# Patient Record
Sex: Male | Born: 1960 | Hispanic: No | Marital: Single | Smoking: Current every day smoker
Health system: Southern US, Community
[De-identification: ages and names within clinical notes are randomized; demographics above are authoritative.]

## PROBLEM LIST (undated history)

## (undated) DIAGNOSIS — F4310 Post-traumatic stress disorder, unspecified: Secondary | ICD-10-CM

## (undated) DIAGNOSIS — M722 Plantar fascial fibromatosis: Secondary | ICD-10-CM

## (undated) DIAGNOSIS — M1990 Unspecified osteoarthritis, unspecified site: Secondary | ICD-10-CM

## (undated) DIAGNOSIS — F319 Bipolar disorder, unspecified: Secondary | ICD-10-CM

---

## 2009-02-06 ENCOUNTER — Emergency Department (HOSPITAL_COMMUNITY): Admission: EM | Admit: 2009-02-06 | Discharge: 2009-02-08 | Payer: Self-pay | Admitting: Emergency Medicine

## 2009-02-08 ENCOUNTER — Inpatient Hospital Stay (HOSPITAL_COMMUNITY): Admission: AD | Admit: 2009-02-08 | Discharge: 2009-02-11 | Payer: Self-pay | Admitting: Psychiatry

## 2009-02-08 ENCOUNTER — Ambulatory Visit: Payer: Self-pay | Admitting: Psychiatry

## 2009-02-13 ENCOUNTER — Emergency Department (HOSPITAL_COMMUNITY): Admission: EM | Admit: 2009-02-13 | Discharge: 2009-02-13 | Payer: Self-pay | Admitting: Family Medicine

## 2009-09-26 ENCOUNTER — Emergency Department (HOSPITAL_COMMUNITY): Admission: EM | Admit: 2009-09-26 | Discharge: 2009-09-26 | Payer: Self-pay | Admitting: Family Medicine

## 2010-07-11 LAB — DIFFERENTIAL
Basophils Absolute: 0 10*3/uL (ref 0.0–0.1)
Basophils Relative: 1 % (ref 0–1)
Eosinophils Absolute: 0.3 10*3/uL (ref 0.0–0.7)
Eosinophils Relative: 4 % (ref 0–5)
Monocytes Absolute: 0.6 10*3/uL (ref 0.1–1.0)

## 2010-07-11 LAB — CBC
HCT: 46.8 % (ref 39.0–52.0)
Hemoglobin: 15.9 g/dL (ref 13.0–17.0)
MCHC: 33.9 g/dL (ref 30.0–36.0)
MCV: 95.1 fL (ref 78.0–100.0)
RDW: 13.1 % (ref 11.5–15.5)

## 2010-07-28 LAB — CBC
Hemoglobin: 16.1 g/dL (ref 13.0–17.0)
MCHC: 34.2 g/dL (ref 30.0–36.0)
Platelets: 213 10*3/uL (ref 150–400)
RDW: 12 % (ref 11.5–15.5)

## 2010-07-28 LAB — TRICYCLICS SCREEN, URINE: TCA Scrn: NOT DETECTED

## 2010-07-28 LAB — DIFFERENTIAL
Basophils Absolute: 0.1 10*3/uL (ref 0.0–0.1)
Basophils Relative: 2 % — ABNORMAL HIGH (ref 0–1)
Monocytes Absolute: 0.6 10*3/uL (ref 0.1–1.0)
Neutro Abs: 4.4 10*3/uL (ref 1.7–7.7)
Neutrophils Relative %: 61 % (ref 43–77)

## 2010-07-28 LAB — RAPID URINE DRUG SCREEN, HOSP PERFORMED: Cocaine: POSITIVE — AB

## 2012-09-29 ENCOUNTER — Emergency Department (HOSPITAL_COMMUNITY)
Admission: EM | Admit: 2012-09-29 | Discharge: 2012-09-29 | Disposition: A | Discharge status: Home / Self Care | Payer: Self-pay | Source: Home / Self Care | Attending: Family Medicine | Admitting: Family Medicine

## 2012-09-29 ENCOUNTER — Encounter (HOSPITAL_COMMUNITY): Payer: Self-pay

## 2012-09-29 DIAGNOSIS — M722 Plantar fascial fibromatosis: Secondary | ICD-10-CM

## 2012-09-29 HISTORY — DX: Post-traumatic stress disorder, unspecified: F43.10

## 2012-09-29 MED ORDER — LIDOCAINE 5 % EX PTCH: MEDICATED_PATCH | CUTANEOUS | Status: DC

## 2012-09-29 MED ORDER — CYCLOBENZAPRINE HCL 10 MG PO TABS: 10.0000 mg | ORAL_TABLET | Freq: Every evening | ORAL | Status: DC | PRN

## 2012-09-29 MED ORDER — IBUPROFEN 600 MG PO TABS: 600.0000 mg | ORAL_TABLET | Freq: Three times a day (TID) | ORAL | Status: DC

## 2012-09-29 MED ORDER — HYDROCODONE-ACETAMINOPHEN 5-325 MG PO TABS: 2.0000 | ORAL_TABLET | Freq: Three times a day (TID) | ORAL | Status: DC | PRN

## 2012-09-29 MED ORDER — TRAMADOL HCL 50 MG PO TABS: 50.0000 mg | ORAL_TABLET | Freq: Four times a day (QID) | ORAL | Status: DC | PRN

## 2012-09-29 NOTE — ED Notes (Signed)
MD aware of new allergy

## 2012-09-29 NOTE — ED Notes (Signed)
Reports his new shoe inserts from podiatrist do not help his plantar surface foot pain; had the inserts for 1 month; cannot get back in to see his MD or the MD at the [facility] for a while ; NAD

## 2012-09-30 NOTE — ED Provider Notes (Signed)
History     CSN: [id number]  Arrival date & time 09/29/12  1614   First MD Initiated Contact with Patient 09/29/12 1724      Chief Complaint  Patient presents with  . Foot Pain    (Consider location/radiation/quality/duration/timing/severity/associated sxs/prior treatment) HPI Comments: 52 year old smoker male here complaining of left foot sole pain for one month. Has had intermittent pain on the right side as well but worse on the left. He has been seen at the [facility] for this symptom and had a recommendation to use shoe inserts he used this inserts for a while without improvement. States pain is worse at nighttime. Has kept him awake in the last 2 nights. Constant massage of the soles helped some. Has a history of pes planus. Denies recent falls. Or known injury. Patient wears cowboy boots.    Past Medical History  Diagnosis Date  . PTSD (post-traumatic stress disorder)     History reviewed. No pertinent past surgical history.  History reviewed. No pertinent family history.  History  Substance Use Topics  . Smoking status: Current Every Day Smoker  . Smokeless tobacco: Not on file  . Alcohol Use: No      Review of Systems  Constitutional: Negative for fever, chills and fatigue.  Musculoskeletal: Negative for back pain.       Left foot pain as per history of present illness per  Skin: Negative for rash and wound.  All other systems reviewed and are negative.    Allergies  Tramadol  Home Medications   Current Outpatient Rx  Name  Route  Sig  Dispense  Refill  . ARIPiprazole (ABILIFY) 10 MG tablet   Oral   Take 10 mg by mouth daily.         . cyclobenzaprine (FLEXERIL) 10 MG tablet   Oral   Take 1 tablet (10 mg total) by mouth at bedtime as needed for muscle spasms.   15 tablet   0   . HYDROcodone-acetaminophen (NORCO/VICODIN) 5-325 MG per tablet   Oral   Take 2 tablets by mouth every 8 (eight) hours as needed for pain.   6 tablet   0   .  ibuprofen (ADVIL,MOTRIN) 600 MG tablet   Oral   Take 1 tablet (600 mg total) by mouth 3 (three) times daily after meals.   21 tablet   0   . lidocaine (LIDODERM) 5 %      Cut each patch in 4 pieces and apply 1 to 3 pieces over tender area. Remove & Discard patch once an daily.   6 patch   0     BP 149/94  Pulse 86  Temp(Src) 98.9 F (37.2 C) (Oral)  Resp 19  SpO2 100%  Physical Exam  Nursing note and vitals reviewed. Constitutional: He is oriented to person, place, and time. He appears well-developed and well-nourished. No distress.  Cardiovascular: Normal heart sounds.   Pulmonary/Chest: Breath sounds normal.  Musculoskeletal:  left foot: pes planus. Impress descended prominent medial foot. There is tenderness to palpation over medial border of mid foot and anterior to heel. Impress thin sole pad.  No bruising. No wounds or calluses.  Entire left foot Impress neurovascularly intact.   Neurological: He is alert and oriented to person, place, and time.  Skin: No rash noted. He is not diaphoretic.    ED Course  Procedures (including critical care time)  Labs Reviewed - No data to display No results found.   1. Plantar fasciitis  MDM  Prescribed flexeril, norco #6 tablets, ibuprofen and lidocaine patch.  Supportive care and red flags that should prompt his return to medical attention discussed with patient and provided in writing.  Sports medicine referral as needed.         Sharin Grave, MD 09/30/12 [phone]

## 2014-03-16 ENCOUNTER — Emergency Department (HOSPITAL_COMMUNITY)
Admission: EM | Admit: 2014-03-16 | Discharge: 2014-03-17 | Disposition: A | Discharge status: Home / Self Care | Payer: Self-pay | Attending: Emergency Medicine | Admitting: Emergency Medicine

## 2014-03-16 DIAGNOSIS — IMO0002 Reserved for concepts with insufficient information to code with codable children: Secondary | ICD-10-CM

## 2014-03-16 DIAGNOSIS — S61411A Laceration without foreign body of right hand, initial encounter: Secondary | ICD-10-CM | POA: Insufficient documentation

## 2014-03-16 DIAGNOSIS — Z720 Tobacco use: Secondary | ICD-10-CM | POA: Insufficient documentation

## 2014-03-16 DIAGNOSIS — S61419A Laceration without foreign body of unspecified hand, initial encounter: Secondary | ICD-10-CM

## 2014-03-16 DIAGNOSIS — S76109A Unspecified injury of unspecified quadriceps muscle, fascia and tendon, initial encounter: Secondary | ICD-10-CM | POA: Insufficient documentation

## 2014-03-16 DIAGNOSIS — M1990 Unspecified osteoarthritis, unspecified site: Secondary | ICD-10-CM | POA: Insufficient documentation

## 2014-03-16 DIAGNOSIS — Y9389 Activity, other specified: Secondary | ICD-10-CM | POA: Insufficient documentation

## 2014-03-16 DIAGNOSIS — W25XXXA Contact with sharp glass, initial encounter: Secondary | ICD-10-CM | POA: Insufficient documentation

## 2014-03-16 DIAGNOSIS — Z23 Encounter for immunization: Secondary | ICD-10-CM | POA: Insufficient documentation

## 2014-03-16 DIAGNOSIS — Y9289 Other specified places as the place of occurrence of the external cause: Secondary | ICD-10-CM | POA: Insufficient documentation

## 2014-03-16 DIAGNOSIS — Z79899 Other long term (current) drug therapy: Secondary | ICD-10-CM | POA: Insufficient documentation

## 2014-03-16 DIAGNOSIS — Y998 Other external cause status: Secondary | ICD-10-CM | POA: Insufficient documentation

## 2014-03-16 DIAGNOSIS — F319 Bipolar disorder, unspecified: Secondary | ICD-10-CM | POA: Insufficient documentation

## 2014-03-16 MED ORDER — TETANUS-DIPHTH-ACELL PERTUSSIS 5-2.5-18.5 LF-MCG/0.5 IM SUSP
0.5000 mL | Freq: Once | INTRAMUSCULAR | Status: AC
  Administered 2014-03-17: 0.5 mL via INTRAMUSCULAR
  Filled 2014-03-16: qty 0.5
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
## 2014-03-17 ENCOUNTER — Ambulatory Visit (HOSPITAL_COMMUNITY)
Admission: AD | Admit: 2014-03-17 | Discharge: 2014-03-17 | Disposition: A | Discharge status: Home / Self Care | Payer: Non-veteran care | Source: Ambulatory Visit | Attending: Orthopedic Surgery | Admitting: Orthopedic Surgery

## 2014-03-17 ENCOUNTER — Other Ambulatory Visit: Payer: Self-pay | Admitting: Orthopedic Surgery

## 2014-03-17 ENCOUNTER — Encounter (HOSPITAL_COMMUNITY): Payer: Self-pay | Admitting: *Deleted

## 2014-03-17 ENCOUNTER — Emergency Department (HOSPITAL_COMMUNITY): Payer: Self-pay

## 2014-03-17 ENCOUNTER — Ambulatory Visit (HOSPITAL_COMMUNITY): Payer: Non-veteran care

## 2014-03-17 ENCOUNTER — Encounter (HOSPITAL_COMMUNITY): Payer: Self-pay | Admitting: Emergency Medicine

## 2014-03-17 ENCOUNTER — Encounter (HOSPITAL_COMMUNITY)
Admission: AD | Disposition: A | Discharge status: Home / Self Care | Payer: Self-pay | Source: Ambulatory Visit | Attending: Orthopedic Surgery

## 2014-03-17 DIAGNOSIS — S66124A Laceration of flexor muscle, fascia and tendon of right ring finger at wrist and hand level, initial encounter: Secondary | ICD-10-CM | POA: Diagnosis not present

## 2014-03-17 DIAGNOSIS — S66126A Laceration of flexor muscle, fascia and tendon of right little finger at wrist and hand level, initial encounter: Secondary | ICD-10-CM | POA: Diagnosis not present

## 2014-03-17 DIAGNOSIS — F17210 Nicotine dependence, cigarettes, uncomplicated: Secondary | ICD-10-CM | POA: Insufficient documentation

## 2014-03-17 DIAGNOSIS — M1990 Unspecified osteoarthritis, unspecified site: Secondary | ICD-10-CM | POA: Diagnosis not present

## 2014-03-17 DIAGNOSIS — S65510A Laceration of blood vessel of right index finger, initial encounter: Secondary | ICD-10-CM | POA: Insufficient documentation

## 2014-03-17 DIAGNOSIS — F319 Bipolar disorder, unspecified: Secondary | ICD-10-CM | POA: Diagnosis not present

## 2014-03-17 DIAGNOSIS — S61411A Laceration without foreign body of right hand, initial encounter: Secondary | ICD-10-CM | POA: Insufficient documentation

## 2014-03-17 DIAGNOSIS — M722 Plantar fascial fibromatosis: Secondary | ICD-10-CM | POA: Insufficient documentation

## 2014-03-17 DIAGNOSIS — F4310 Post-traumatic stress disorder, unspecified: Secondary | ICD-10-CM | POA: Diagnosis not present

## 2014-03-17 DIAGNOSIS — S64490A Injury of digital nerve of right index finger, initial encounter: Secondary | ICD-10-CM | POA: Diagnosis not present

## 2014-03-17 DIAGNOSIS — W25XXXA Contact with sharp glass, initial encounter: Secondary | ICD-10-CM | POA: Insufficient documentation

## 2014-03-17 DIAGNOSIS — Z79899 Other long term (current) drug therapy: Secondary | ICD-10-CM | POA: Diagnosis not present

## 2014-03-17 DIAGNOSIS — Z791 Long term (current) use of non-steroidal anti-inflammatories (NSAID): Secondary | ICD-10-CM | POA: Diagnosis not present

## 2014-03-17 DIAGNOSIS — S66120A Laceration of flexor muscle, fascia and tendon of right index finger at wrist and hand level, initial encounter: Secondary | ICD-10-CM | POA: Diagnosis present

## 2014-03-17 HISTORY — PX: NERVE, TENDON AND ARTERY REPAIR: SHX5695

## 2014-03-17 HISTORY — DX: Plantar fascial fibromatosis: M72.2

## 2014-03-17 HISTORY — DX: Bipolar disorder, unspecified: F31.9

## 2014-03-17 HISTORY — DX: Unspecified osteoarthritis, unspecified site: M19.90

## 2014-03-17 LAB — I-STAT CHEM 8, ED
BUN: 20 mg/dL (ref 6–23)
CALCIUM ION: 1.15 mmol/L (ref 1.12–1.23)
CREATININE: 0.9 mg/dL (ref 0.50–1.35)
Chloride: 105 mEq/L (ref 96–112)
Glucose, Bld: 95 mg/dL (ref 70–99)
HCT: 45 % (ref 39.0–52.0)
HEMOGLOBIN: 15.3 g/dL (ref 13.0–17.0)
Potassium: 3.9 mEq/L (ref 3.7–5.3)
SODIUM: 139 meq/L (ref 137–147)
TCO2: 22 mmol/L (ref 0–100)

## 2014-03-17 SURGERY — NERVE, TENDON AND ARTERY REPAIR
Anesthesia: General | Site: Hand | Laterality: Right

## 2014-03-17 MED ORDER — MIDAZOLAM HCL 2 MG/2ML IJ SOLN
INTRAMUSCULAR | Status: AC
  Filled 2014-03-17: qty 2

## 2014-03-17 MED ORDER — PROPOFOL 10 MG/ML IV BOLUS
INTRAVENOUS | Status: DC | PRN
  Administered 2014-03-17: 170 mg via INTRAVENOUS

## 2014-03-17 MED ORDER — OXYCODONE HCL 5 MG/5ML PO SOLN: 5.0000 mg | Freq: Once | ORAL | Status: AC | PRN

## 2014-03-17 MED ORDER — LIDOCAINE HCL (PF) 1 % IJ SOLN
INTRAMUSCULAR | Status: AC
  Filled 2014-03-17: qty 5

## 2014-03-17 MED ORDER — SUCCINYLCHOLINE CHLORIDE 20 MG/ML IJ SOLN
INTRAMUSCULAR | Status: DC | PRN
  Administered 2014-03-17: 100 mg via INTRAVENOUS

## 2014-03-17 MED ORDER — LORAZEPAM 2 MG/ML IJ SOLN
1.0000 mg | Freq: Once | INTRAMUSCULAR | Status: AC
  Administered 2014-03-17: 1 mg via INTRAVENOUS

## 2014-03-17 MED ORDER — HYDROMORPHONE HCL 1 MG/ML IJ SOLN
INTRAMUSCULAR | Status: AC
  Filled 2014-03-17: qty 1

## 2014-03-17 MED ORDER — MIDAZOLAM HCL 5 MG/5ML IJ SOLN
INTRAMUSCULAR | Status: DC | PRN
  Administered 2014-03-17: 2 mg via INTRAVENOUS

## 2014-03-17 MED ORDER — LIDOCAINE HCL (PF) 1 % IJ SOLN
5.0000 mL | Freq: Once | INTRAMUSCULAR | Status: DC
  Filled 2014-03-17: qty 5

## 2014-03-17 MED ORDER — HYDROMORPHONE HCL 1 MG/ML IJ SOLN
0.2500 mg | INTRAMUSCULAR | Status: DC | PRN
  Administered 2014-03-17 (×3): 0.5 mg via INTRAVENOUS

## 2014-03-17 MED ORDER — FENTANYL CITRATE 0.05 MG/ML IJ SOLN
INTRAMUSCULAR | Status: AC
  Filled 2014-03-17: qty 5

## 2014-03-17 MED ORDER — CEPHALEXIN 500 MG PO CAPS: 500.0000 mg | ORAL_CAPSULE | Freq: Four times a day (QID) | ORAL | Status: DC

## 2014-03-17 MED ORDER — HYDROCODONE-ACETAMINOPHEN 5-325 MG PO TABS: 1.0000 | ORAL_TABLET | Freq: Three times a day (TID) | ORAL | Status: DC | PRN

## 2014-03-17 MED ORDER — PHENYLEPHRINE HCL 10 MG/ML IJ SOLN
INTRAMUSCULAR | Status: DC | PRN
  Administered 2014-03-17: 80 ug via INTRAVENOUS

## 2014-03-17 MED ORDER — LACTATED RINGERS IV SOLN
INTRAVENOUS | Status: DC | PRN
  Administered 2014-03-17: 17:00:00 via INTRAVENOUS

## 2014-03-17 MED ORDER — FENTANYL CITRATE 0.05 MG/ML IJ SOLN
100.0000 ug | Freq: Once | INTRAMUSCULAR | Status: AC
  Administered 2014-03-17: 100 ug via INTRAVENOUS
  Filled 2014-03-17: qty 2

## 2014-03-17 MED ORDER — MEPERIDINE HCL 25 MG/ML IJ SOLN: 6.2500 mg | INTRAMUSCULAR | Status: DC | PRN

## 2014-03-17 MED ORDER — CHLORHEXIDINE GLUCONATE 4 % EX LIQD
60.0000 mL | Freq: Once | CUTANEOUS | Status: DC
  Filled 2014-03-17: qty 60

## 2014-03-17 MED ORDER — ONDANSETRON HCL 4 MG/2ML IJ SOLN: 4.0000 mg | Freq: Once | INTRAMUSCULAR | Status: DC | PRN

## 2014-03-17 MED ORDER — BUPIVACAINE HCL (PF) 0.25 % IJ SOLN
INTRAMUSCULAR | Status: AC
  Filled 2014-03-17: qty 30

## 2014-03-17 MED ORDER — CEFAZOLIN SODIUM-DEXTROSE 2-3 GM-% IV SOLR
2.0000 g | INTRAVENOUS | Status: AC
  Administered 2014-03-17: 2 g via INTRAVENOUS
  Filled 2014-03-17: qty 50

## 2014-03-17 MED ORDER — OXYCODONE-ACETAMINOPHEN 5-325 MG PO TABS: 1.0000 | ORAL_TABLET | ORAL | Status: DC | PRN

## 2014-03-17 MED ORDER — FENTANYL CITRATE 0.05 MG/ML IJ SOLN
50.0000 ug | INTRAMUSCULAR | Status: DC | PRN
  Administered 2014-03-17: 50 ug via INTRAVENOUS

## 2014-03-17 MED ORDER — BUPIVACAINE HCL (PF) 0.25 % IJ SOLN
INTRAMUSCULAR | Status: DC | PRN
  Administered 2014-03-17: 15 mL

## 2014-03-17 MED ORDER — MIDAZOLAM HCL 2 MG/2ML IJ SOLN: 1.0000 mg | INTRAMUSCULAR | Status: DC | PRN

## 2014-03-17 MED ORDER — OXYCODONE HCL 5 MG PO TABS
ORAL_TABLET | ORAL | Status: AC
  Filled 2014-03-17: qty 1

## 2014-03-17 MED ORDER — LACTATED RINGERS IV SOLN
INTRAVENOUS | Status: DC
  Administered 2014-03-17: 14:00:00 via INTRAVENOUS

## 2014-03-17 MED ORDER — LIDOCAINE HCL (PF) 1 % IJ SOLN: 10.0000 mL | Freq: Once | INTRAMUSCULAR | Status: DC

## 2014-03-17 MED ORDER — LIDOCAINE HCL (CARDIAC) 20 MG/ML IV SOLN
INTRAVENOUS | Status: DC | PRN
  Administered 2014-03-17: 50 mg via INTRAVENOUS

## 2014-03-17 MED ORDER — OXYCODONE HCL 5 MG PO TABS
5.0000 mg | ORAL_TABLET | Freq: Once | ORAL | Status: AC | PRN
  Administered 2014-03-17: 5 mg via ORAL

## 2014-03-17 MED ORDER — CEFAZOLIN SODIUM 1-5 GM-% IV SOLN
1.0000 g | Freq: Once | INTRAVENOUS | Status: AC
  Administered 2014-03-17: 1 g via INTRAVENOUS
  Filled 2014-03-17: qty 50

## 2014-03-17 MED ORDER — SODIUM CHLORIDE 0.9 % IR SOLN
Status: DC | PRN
  Administered 2014-03-17: 1000 mL

## 2014-03-17 MED ORDER — FENTANYL CITRATE 0.05 MG/ML IJ SOLN
INTRAMUSCULAR | Status: AC
  Administered 2014-03-17: 50 ug via INTRAVENOUS
  Filled 2014-03-17: qty 2

## 2014-03-17 MED ORDER — FENTANYL CITRATE 0.05 MG/ML IJ SOLN
INTRAMUSCULAR | Status: DC | PRN
  Administered 2014-03-17 (×2): 50 ug via INTRAVENOUS
  Administered 2014-03-17 (×2): 100 ug via INTRAVENOUS
  Administered 2014-03-17: 50 ug via INTRAVENOUS

## 2014-03-17 SURGICAL SUPPLY — 56 items
BANDAGE ADH SHEER 1  50/CT (GAUZE/BANDAGES/DRESSINGS) ×3 IMPLANT
BANDAGE ELASTIC 4 VELCRO ST LF (GAUZE/BANDAGES/DRESSINGS) ×3 IMPLANT
BNDG CMPR 9X4 STRL LF SNTH (GAUZE/BANDAGES/DRESSINGS) ×1
BNDG ESMARK 4X9 LF (GAUZE/BANDAGES/DRESSINGS) ×3 IMPLANT
BNDG GAUZE ELAST 4 BULKY (GAUZE/BANDAGES/DRESSINGS) ×3 IMPLANT
CLOSURE WOUND 1/2 X4 (GAUZE/BANDAGES/DRESSINGS)
CORDS BIPOLAR (ELECTRODE) ×3 IMPLANT
COVER SURGICAL LIGHT HANDLE (MISCELLANEOUS) ×3 IMPLANT
CUFF TOURNIQUET SINGLE 18IN (TOURNIQUET CUFF) IMPLANT
CUFF TOURNIQUET SINGLE 24IN (TOURNIQUET CUFF) ×3 IMPLANT
DECANTER SPIKE VIAL GLASS SM (MISCELLANEOUS) IMPLANT
DRAPE OEC MINIVIEW 54X84 (DRAPES) IMPLANT
DRAPE SURG 17X23 STRL (DRAPES) ×3 IMPLANT
DURAPREP 26ML APPLICATOR (WOUND CARE) ×3 IMPLANT
GAUZE SPONGE 4X4 12PLY STRL (GAUZE/BANDAGES/DRESSINGS) IMPLANT
GAUZE XEROFORM 1X8 LF (GAUZE/BANDAGES/DRESSINGS) ×3 IMPLANT
GLOVE BIO SURGEON STRL SZ8.5 (GLOVE) ×3 IMPLANT
GLOVE BIOGEL PI IND STRL 8 (GLOVE) ×1 IMPLANT
GLOVE BIOGEL PI INDICATOR 8 (GLOVE) ×2
GOWN STRL REUS W/ TWL LRG LVL3 (GOWN DISPOSABLE) ×1 IMPLANT
GOWN STRL REUS W/ TWL XL LVL3 (GOWN DISPOSABLE) ×1 IMPLANT
GOWN STRL REUS W/TWL LRG LVL3 (GOWN DISPOSABLE) ×3
GOWN STRL REUS W/TWL XL LVL3 (GOWN DISPOSABLE) ×3
KIT BASIN OR (CUSTOM PROCEDURE TRAY) ×3 IMPLANT
KIT ROOM TURNOVER OR (KITS) ×3 IMPLANT
LOOP VESSEL MAXI BLUE (MISCELLANEOUS) IMPLANT
MANIFOLD NEPTUNE II (INSTRUMENTS) IMPLANT
NEEDLE HYPO 25GX1X1/2 BEV (NEEDLE) ×3 IMPLANT
NS IRRIG 1000ML POUR BTL (IV SOLUTION) ×3 IMPLANT
PACK ORTHO EXTREMITY (CUSTOM PROCEDURE TRAY) ×3 IMPLANT
PAD ARMBOARD 7.5X6 YLW CONV (MISCELLANEOUS) ×6 IMPLANT
PAD CAST 4YDX4 CTTN HI CHSV (CAST SUPPLIES) ×1 IMPLANT
PADDING CAST COTTON 4X4 STRL (CAST SUPPLIES) ×3
SPEAR EYE SURG WECK-CEL (MISCELLANEOUS) IMPLANT
SPECIMEN JAR SMALL (MISCELLANEOUS) IMPLANT
SPLINT FIBERGLASS 3X12 (CAST SUPPLIES) ×3 IMPLANT
SPONGE GAUZE 4X4 12PLY STER LF (GAUZE/BANDAGES/DRESSINGS) ×3 IMPLANT
STRIP CLOSURE SKIN 1/2X4 (GAUZE/BANDAGES/DRESSINGS) IMPLANT
SUCTION FRAZIER TIP 10 FR DISP (SUCTIONS) ×3 IMPLANT
SUT ETHIBOND 3-0 V-5 (SUTURE) ×6 IMPLANT
SUT ETHILON 4 0 P 3 18 (SUTURE) ×3 IMPLANT
SUT ETHILON 5 0 PS 2 18 (SUTURE) IMPLANT
SUT MERSILENE 4 0 P 3 (SUTURE) ×3 IMPLANT
SUT NYLON 9 0 VRM6 (SUTURE) ×3 IMPLANT
SUT PROLENE 3 0 PS 2 (SUTURE) IMPLANT
SUT PROLENE 6 0 C 1 30 (SUTURE) ×3 IMPLANT
SUT SILK 4 0 PS 2 (SUTURE) IMPLANT
SUT VIC AB 3-0 FS2 27 (SUTURE) IMPLANT
SUT VICRYL RAPIDE 4/0 PS 2 (SUTURE) ×12 IMPLANT
SYR CONTROL 10ML LL (SYRINGE) ×3 IMPLANT
TOWEL OR 17X24 6PK STRL BLUE (TOWEL DISPOSABLE) ×3 IMPLANT
TOWEL OR 17X26 10 PK STRL BLUE (TOWEL DISPOSABLE) ×3 IMPLANT
TUBE CONNECTING 12'X1/4 (SUCTIONS) ×1
TUBE CONNECTING 12X1/4 (SUCTIONS) ×2 IMPLANT
UNDERPAD 30X30 INCONTINENT (UNDERPADS AND DIAPERS) ×3 IMPLANT
WATER STERILE IRR 1000ML POUR (IV SOLUTION) IMPLANT

## 2014-03-17 NOTE — ED Notes (Signed)
Sutures present on right hand. Pressure gauze applied.

## 2014-03-17 NOTE — ED Notes (Signed)
Dr. Ranae Palmsyelverton and Tresa EndoKelly, PA-C, at the bedside.

## 2014-03-17 NOTE — ED Notes (Signed)
Called main pharmacy and spoke to veronda, 1 mg of ativan given to patient, 1mg  waste, verified with Ian MalkinZach, RCharity fundraiser

## 2014-03-17 NOTE — Anesthesia Procedure Notes (Signed)
Procedure Name: Intubation Date/Time: 03/17/2014 4:54 PM Performed by: Gwenyth AllegraADAMI, Lamoine Magallon Pre-anesthesia Checklist: Emergency Drugs available, Patient identified, Timeout performed, Suction available and Patient being monitored Patient Re-evaluated:Patient Re-evaluated prior to inductionOxygen Delivery Method: Circle system utilized Preoxygenation: Pre-oxygenation with 100% oxygen Intubation Type: IV induction Ventilation: Mask ventilation without difficulty Laryngoscope Size: Mac and 4 Grade View: Grade I Tube type: Oral Tube size: 7.5 mm Number of attempts: 1 Airway Equipment and Method: Stylet Placement Confirmation: ETT inserted through vocal cords under direct vision,  breath sounds checked- equal and bilateral and positive ETCO2 Secured at: 22 cm Tube secured with: Tape Dental Injury: Teeth and Oropharynx as per pre-operative assessment

## 2014-03-17 NOTE — Anesthesia Postprocedure Evaluation (Signed)
  Anesthesia Post-op Note  Patient: Kenneth Oneill  Procedure(s) Performed: Procedure(s): EXPLORE AND REPAIR RIGHT HAND (Right)  Patient Location: PACU  Anesthesia Type:General  Level of Consciousness: awake and alert   Airway and Oxygen Therapy: Patient Spontanous Breathing  Post-op Pain: mild  Post-op Assessment: Post-op Vital signs reviewed  Post-op Vital Signs: stable  Last Vitals:  Filed Vitals:   03/17/14 1954  BP: 130/91  Pulse: 94  Temp:   Resp: 13    Complications: No apparent anesthesia complications

## 2014-03-17 NOTE — ED Notes (Signed)
Dr. Ranae PalmsYelverton present on arrival to room

## 2014-03-17 NOTE — ED Notes (Signed)
Spoke to Dr. Ranae PalmsYelverton in regards to pain. MD gives verbal order for 100mcg of fentanyl.  Patient is npo, last meal 5pm.

## 2014-03-17 NOTE — Op Note (Signed)
NAMSherlie Ban:  Oneill, Kenneth Oneill              ACCOUNT NO.:  [id number]  MEDICAL RECORD NO.:  [id number]  LOCATION:  [facility]                         FACILITY:  [facility]  PHYSICIAN:  Artist PaisMatthew A. Arlet Marter, M.D.DATE OF BIRTH:  01-15-1961  DATE OF PROCEDURE:  03/17/2014 DATE OF DISCHARGE:  03/17/2014                              OPERATIVE REPORT   PREOPERATIVE DIAGNOSIS:  Deep laceration palmar aspect, right hand.  POSTOPERATIVE DIAGNOSIS:  Deep laceration palmar aspect, right hand.  PROCEDURE: 1. Zone II flexor digitorum superficialis and profundus repairs, right     small finger. 2. Zone II profundus repair, right ring finger, and microscopic repair     of radial digital artery and radial digital nerve, index finger.  SURGEON:  Artist PaisMatthew A. Mina MarbleWeingold, MD  ASSISTANT:  None.  ANESTHESIA:  General.  COMPLICATION:  No complication.  DRAINS:  No drains.  DESCRIPTION OF PROCEDURE:  The patient was taken to the operating suite. After induction of adequate general anesthetic, right upper extremity was prepped and draped in sterile fashion.  Prior to raising the tourniquet, sutures that were placed in the emergency room were removed from a transverse laceration along the A1 pulley area of the index, long, ring, and small fingers with loss of active flexion of the small finger, weakness in the ring finger, and numbness in the radial digital nerve distribution, index finger.  After the sutures were removed, the tourniquet was raised.  We thoroughly irrigated the wound with normal saline.  Inspection revealed complete laceration of the radial neurovascular bundle of the index finger as well as profundus tendon laceration to the ring finger at zone II as well as zone II superficialis and profundus laceration, small finger.  Under macroscopic magnification, we repaired the small finger zone II flexor tendon using 4-0 Mersilene to the superficialis, followed by 3-0 double-armed Ethibond in both limbs of the  profundus tendon, this was at [place] chiasm.  We reinforced both repairs with 6-0 Prolene in a running locked epitendinous stitch.  We then proceeded to the ring finger and repaired the profundus tendon using a 6-0 Prolene in a running locked epitendinous stitch for a partial tendon laceration.  The wounds were then irrigated one last time.  We brought the microscope onto the field. We then repaired under microscopic magnification the radial digital artery and nerve of the index finger on the right  using 9-0 nylon.  At the end of the procedure, the wounds were irrigated one final time, and we loosely closed all the wounds with 4-0 Vicryl Rapide.  Xeroform, 4x4s, and a compressive dressing and dorsal extension block splint was applied.  The patient tolerated the procedure well and went to recovery room in stable fashion.     Artist PaisMatthew A. Mina MarbleWeingold, M.D.     MAW/MEDQ  D:  03/17/2014  T:  03/17/2014  Job:  [id number]

## 2014-03-17 NOTE — ED Notes (Signed)
Spoke to Dr. Ranae Palmsyelverton in regards to patient's anxiety, and ready to leave. MD gives verbal order for 1mg  of ativan.

## 2014-03-17 NOTE — H&P (Signed)
Kenneth Oneill is an 53 y.o. male.   Chief Complaint: right hand laceration with decreased flexion and numbness HPI: as above s/p deep laceration to volar right hand  Past Medical History  Diagnosis Date  . PTSD (post-traumatic stress disorder)   . Bipolar disorder   . Arthritis   . Plantar fasciitis, bilateral     History reviewed. No pertinent past surgical history.  History reviewed. No pertinent family history. Social History:  reports that he has been smoking Cigarettes.  He has been smoking about 0.50 packs per day. He has never used smokeless tobacco. He reports that he drinks alcohol. His drug history is not on file.  Allergies:  Allergies  Allergen Reactions  . Tramadol Hives    Mentioned had allergy post d/c    Medications Prior to Admission  Medication Sig Dispense Refill  . ARIPiprazole (ABILIFY) 10 MG tablet Take 10 mg by mouth daily.    . cephALEXin (KEFLEX) 500 MG capsule Take 1 capsule (500 mg total) by mouth 4 (four) times daily. 28 capsule 0  . citalopram (CELEXA) 10 MG tablet Take 10 mg by mouth daily.    Marland Kitchen. HYDROcodone-acetaminophen (NORCO/VICODIN) 5-325 MG per tablet Take 1-2 tablets by mouth every 8 (eight) hours as needed for severe pain. 20 tablet 0  . ibuprofen (ADVIL,MOTRIN) 600 MG tablet Take 1 tablet (600 mg total) by mouth 3 (three) times daily after meals. 21 tablet 0  . Ibuprofen-Diphenhydramine HCl (ADVIL PM) 200-25 MG CAPS Take by mouth as needed (as needed for mild/pain & sleep).    Marland Kitchen. lidocaine (LIDODERM) 5 % Cut each patch in 4 pieces and apply 1 to 3 pieces over tender area. Remove & Discard patch once an daily. 6 patch 0  . cyclobenzaprine (FLEXERIL) 10 MG tablet Take 1 tablet (10 mg total) by mouth at bedtime as needed for muscle spasms. 15 tablet 0    Results for orders placed or performed during the hospital encounter of 03/16/14 (from the past 48 hour(s))  I-stat chem 8, ed     Status: None   Collection Time: 03/17/14 12:08 AM  Result  Value Ref Range   Sodium 139 137 - 147 mEq/L   Potassium 3.9 3.7 - 5.3 mEq/L   Chloride 105 96 - 112 mEq/L   BUN 20 6 - 23 mg/dL   Creatinine, Ser 9.810.90 0.50 - 1.35 mg/dL   Glucose, Bld 95 70 - 99 mg/dL   Calcium, Ion 1.911.15 4.781.12 - 1.23 mmol/L   TCO2 22 0 - 100 mmol/L   Hemoglobin 15.3 13.0 - 17.0 g/dL   HCT 29.545.0 62.139.0 - 30.852.0 %   Dg Hand Complete Right  03/17/2014   CLINICAL DATA:  Punched right hand through glass.  Lacerations.  EXAM: RIGHT HAND - COMPLETE 3+ VIEW  COMPARISON:  None.  FINDINGS: There is overlying bandage material. Positioning of the fingers is nonanatomic resulting and overlap of osseous structures. No radiopaque foreign bodies identified  IMPRESSION: 1. Exam detail diminished due to overlying bandage material. 2. No fractures identified.   Electronically Signed   By: Signa Kellaylor  Stroud M.D.   On: 03/17/2014 00:59    Review of Systems  All other systems reviewed and are negative.   Blood pressure 142/97, pulse 80, temperature 97.5 F (36.4 C), temperature source Oral, resp. rate 18, weight 76.204 kg (168 lb), SpO2 100 %. Physical Exam  Constitutional: He is oriented to person, place, and time. He appears well-developed and well-nourished.  HENT:  Head: Normocephalic  and atraumatic.  Cardiovascular: Normal rate.   Respiratory: Effort normal.  Musculoskeletal:       Right hand: He exhibits disruption of two-point discrimination, deformity and laceration.  Right hand volar laceration with T/A/N to small and index  Neurological: He is alert and oriented to person, place, and time.  Skin: Skin is warm.  Psychiatric: He has a normal mood and affect. His behavior is normal. Judgment and thought content normal.     Assessment/Plan As above  Plan explore and repair as needed  Kenneth Oneill A 03/17/2014, 4:23 PM

## 2014-03-17 NOTE — Anesthesia Preprocedure Evaluation (Signed)
Anesthesia Evaluation  Patient identified by MRN, date of birth, ID band Patient awake    Reviewed: Allergy & Precautions, H&P , Patient's Chart, lab work & pertinent test results  Airway Mallampati: I  TM Distance: >3 FB Neck ROM: Full    Dental   Pulmonary Current Smoker,          Cardiovascular     Neuro/Psych Bipolar Disorder    GI/Hepatic   Endo/Other    Renal/GU      Musculoskeletal   Abdominal   Peds  Hematology   Anesthesia Other Findings   Reproductive/Obstetrics                             Anesthesia Physical Anesthesia Plan  ASA: I and emergent  Anesthesia Plan: General   Post-op Pain Management:    Induction: Intravenous  Airway Management Planned: Oral ETT  Additional Equipment:   Intra-op Plan:   Post-operative Plan: Extubation in OR  Informed Consent: I have reviewed the patients History and Physical, chart, labs and discussed the procedure including the risks, benefits and alternatives for the proposed anesthesia with the patient or authorized representative who has indicated his/her understanding and acceptance.     Plan Discussed with: CRNA and Surgeon  Anesthesia Plan Comments:         Anesthesia Quick Evaluation

## 2014-03-17 NOTE — ED Provider Notes (Signed)
LACERATION REPAIR Performed by: Antony MaduraHUMES, Jawanna Dykman Authorized by: Antony MaduraHUMES, Demarious Kapur Consent: Verbal consent obtained. Risks and benefits: risks, benefits and alternatives were discussed Consent given by: patient Patient identity confirmed: provided demographic data Prepped and Draped in normal sterile fashion Wound explored  Laceration Location: palmar aspect of R 5th finger  Laceration Length: 7cm  No Foreign Bodies seen or palpated  Anesthesia: local infiltration  Local anesthetic: lidocaine 1% without epinephrine  Anesthetic total: 5 ml  Irrigation method: syringe Amount of cleaning: standard  Skin closure: 5-0 prolene  Number of sutures: 6  Technique: simple interrupted  Patient tolerance: Patient tolerated the procedure well with no immediate complications.   LACERATION REPAIR Performed by: Antony MaduraHUMES, Landynn Dupler Authorized by: Antony MaduraHUMES, Evagelia Knack Consent: Verbal consent obtained. Risks and benefits: risks, benefits and alternatives were discussed Consent given by: patient Patient identity confirmed: provided demographic data Prepped and Draped in normal sterile fashion Wound explored  Laceration Location: R hand (palmar aspect)  Laceration Length: 15cm  No Foreign Bodies seen or palpated  Anesthesia: local infiltration  Local anesthetic: lidocaine 1% without epinephrine  Anesthetic total: 12 ml  Irrigation method: syringe Amount of cleaning: standard  Skin closure: 5-0 prolene  Number of sutures: 13  Technique: simple interrupted  Patient tolerance: Patient tolerated the procedure well with no immediate complications.   Antony MaduraKelly Andromeda Poppen, PA-C 03/17/14 [id number]  Loren Raceravid Yelverton, MD 03/17/14 [id number]

## 2014-03-17 NOTE — Discharge Instructions (Signed)
Keep hand laceration dry and clean. If you have not heard from Dr. Ronie SpiesWeingold's office in the morning call and make an appointment to be seen. Dr. Mina MarbleWeingold does not want to to eat or drink anything after breakfast this morning in the event that she will be having surgery to repair your tendon later today. Take the pain medication and antibiotic as prescribed. Return for any concerns. Laceration Care, Adult A laceration is a cut or lesion that goes through all layers of the skin and into the tissue just beneath the skin. TREATMENT  Some lacerations may not require closure. Some lacerations may not be able to be closed due to an increased risk of infection. It is important to see your caregiver as soon as possible after an injury to minimize the risk of infection and maximize the opportunity for successful closure. If closure is appropriate, pain medicines may be given, if needed. The wound will be cleaned to help prevent infection. Your caregiver will use stitches (sutures), staples, wound glue (adhesive), or skin adhesive strips to repair the laceration. These tools bring the skin edges together to allow for faster healing and a better cosmetic outcome. However, all wounds will heal with a scar. Once the wound has healed, scarring can be minimized by covering the wound with sunscreen during the day for 1 full year. HOME CARE INSTRUCTIONS  For sutures or staples:  Keep the wound clean and dry.  If you were given a bandage (dressing), you should change it at least once a day. Also, change the dressing if it becomes wet or dirty, or as directed by your caregiver.  Wash the wound with soap and water 2 times a day. Rinse the wound off with water to remove all soap. Pat the wound dry with a clean towel.  After cleaning, apply a thin layer of the antibiotic ointment as recommended by your caregiver. This will help prevent infection and keep the dressing from sticking.  You may shower as usual after the first  24 hours. Do not soak the wound in water until the sutures are removed.  Only take over-the-counter or prescription medicines for pain, discomfort, or fever as directed by your caregiver.  Get your sutures or staples removed as directed by your caregiver. For skin adhesive strips:  Keep the wound clean and dry.  Do not get the skin adhesive strips wet. You may bathe carefully, using caution to keep the wound dry.  If the wound gets wet, pat it dry with a clean towel.  Skin adhesive strips will fall off on their own. You may trim the strips as the wound heals. Do not remove skin adhesive strips that are still stuck to the wound. They will fall off in time. For wound adhesive:  You may briefly wet your wound in the shower or bath. Do not soak or scrub the wound. Do not swim. Avoid periods of heavy perspiration until the skin adhesive has fallen off on its own. After showering or bathing, gently pat the wound dry with a clean towel.  Do not apply liquid medicine, cream medicine, or ointment medicine to your wound while the skin adhesive is in place. This may loosen the film before your wound is healed.  If a dressing is placed over the wound, be careful not to apply tape directly over the skin adhesive. This may cause the adhesive to be pulled off before the wound is healed.  Avoid prolonged exposure to sunlight or tanning lamps while the skin  adhesive is in place. Exposure to ultraviolet light in the first year will darken the scar.  The skin adhesive will usually remain in place for 5 to 10 days, then naturally fall off the skin. Do not pick at the adhesive film. You may need a tetanus shot if:  You cannot remember when you had your last tetanus shot.  You have never had a tetanus shot. If you get a tetanus shot, your arm may swell, get red, and feel warm to the touch. This is common and not a problem. If you need a tetanus shot and you choose not to have one, there is a rare chance of  getting tetanus. Sickness from tetanus can be serious. SEEK MEDICAL CARE IF:   You have redness, swelling, or increasing pain in the wound.  You see a red line that goes away from the wound.  You have yellowish-white fluid (pus) coming from the wound.  You have a fever.  You notice a bad smell coming from the wound or dressing.  Your wound breaks open before or after sutures have been removed.  You notice something coming out of the wound such as wood or glass.  Your wound is on your hand or foot and you cannot move a finger or toe. SEEK IMMEDIATE MEDICAL CARE IF:   Your pain is not controlled with prescribed medicine.  You have severe swelling around the wound causing pain and numbness or a change in color in your arm, hand, leg, or foot.  Your wound splits open and starts bleeding.  You have worsening numbness, weakness, or loss of function of any joint around or beyond the wound.  You develop painful lumps near the wound or on the skin anywhere on your body. MAKE SURE YOU:   Understand these instructions.  Will watch your condition.  Will get help right away if you are not doing well or get worse. Document Released: 04/10/2005 Document Revised: 07/03/2011 Document Reviewed: 10/04/2010 [facility] Patient Information 2015 [place], [place]. This information is not intended to replace advice given to you by your health care provider. Make sure you discuss any questions you have with your health care provider.

## 2014-03-17 NOTE — ED Notes (Signed)
Tresa EndoKelly, PA-C, continuing to suture at the bedside.

## 2014-03-17 NOTE — Op Note (Signed)
See note [id number]

## 2014-03-17 NOTE — Transfer of Care (Signed)
Immediate Anesthesia Transfer of Care Note  Patient: Kenneth Oneill  Procedure(s) Performed: Procedure(s): EXPLORE AND REPAIR RIGHT HAND (Right)  Patient Location: PACU  Anesthesia Type:General  Level of Consciousness: awake, alert  and oriented  Airway & Oxygen Therapy: Patient Spontanous Breathing and Patient connected to nasal cannula oxygen  Post-op Assessment: Report given to PACU RN and Post -op Vital signs reviewed and stable  Post vital signs: Reviewed and stable  Complications: No apparent anesthesia complications

## 2014-03-17 NOTE — ED Notes (Signed)
Patient highly irritated and asking to leave, discussed delay, currently awaiting surgical team.

## 2014-03-18 ENCOUNTER — Encounter (HOSPITAL_COMMUNITY): Payer: Self-pay | Admitting: Orthopedic Surgery

## 2014-12-25 ENCOUNTER — Emergency Department (HOSPITAL_COMMUNITY)
Admission: EM | Admit: 2014-12-25 | Discharge: 2014-12-25 | Discharge status: Absent without leave | Payer: Non-veteran care

## 2016-06-10 ENCOUNTER — Emergency Department (HOSPITAL_COMMUNITY)
Admission: EM | Admit: 2016-06-10 | Discharge: 2016-06-10 | Disposition: A | Discharge status: Home / Self Care | Payer: Medicaid Other | Attending: Emergency Medicine | Admitting: Emergency Medicine

## 2016-06-10 ENCOUNTER — Encounter (HOSPITAL_COMMUNITY): Payer: Self-pay | Admitting: Emergency Medicine

## 2016-06-10 DIAGNOSIS — W5501XA Bitten by cat, initial encounter: Secondary | ICD-10-CM | POA: Diagnosis not present

## 2016-06-10 DIAGNOSIS — Z203 Contact with and (suspected) exposure to rabies: Secondary | ICD-10-CM | POA: Insufficient documentation

## 2016-06-10 DIAGNOSIS — Z79899 Other long term (current) drug therapy: Secondary | ICD-10-CM | POA: Insufficient documentation

## 2016-06-10 DIAGNOSIS — Y939 Activity, unspecified: Secondary | ICD-10-CM | POA: Insufficient documentation

## 2016-06-10 DIAGNOSIS — Y999 Unspecified external cause status: Secondary | ICD-10-CM | POA: Diagnosis not present

## 2016-06-10 DIAGNOSIS — S51851A Open bite of right forearm, initial encounter: Secondary | ICD-10-CM | POA: Insufficient documentation

## 2016-06-10 DIAGNOSIS — Z23 Encounter for immunization: Secondary | ICD-10-CM | POA: Insufficient documentation

## 2016-06-10 DIAGNOSIS — Y929 Unspecified place or not applicable: Secondary | ICD-10-CM | POA: Diagnosis not present

## 2016-06-10 DIAGNOSIS — F17210 Nicotine dependence, cigarettes, uncomplicated: Secondary | ICD-10-CM | POA: Insufficient documentation

## 2016-06-10 MED ORDER — HYDROCODONE-ACETAMINOPHEN 5-325 MG PO TABS: 1.0000 | ORAL_TABLET | Freq: Four times a day (QID) | ORAL | 0 refills | Status: DC | PRN

## 2016-06-10 MED ORDER — RABIES VACCINE, PCEC IM SUSR
1.0000 mL | Freq: Once | INTRAMUSCULAR | Status: AC
  Administered 2016-06-10: 1 mL via INTRAMUSCULAR
  Filled 2016-06-10: qty 1

## 2016-06-10 MED ORDER — HYDROCODONE-ACETAMINOPHEN 5-325 MG PO TABS
1.0000 | ORAL_TABLET | Freq: Once | ORAL | Status: AC
  Administered 2016-06-10: 1 via ORAL
  Filled 2016-06-10: qty 1

## 2016-06-10 MED ORDER — RABIES IMMUNE GLOBULIN 150 UNIT/ML IM INJ
20.0000 [IU]/kg | INJECTION | Freq: Once | INTRAMUSCULAR | Status: AC
  Administered 2016-06-10: 1425 [IU] via INTRAMUSCULAR
  Filled 2016-06-10: qty 9.5

## 2016-06-10 MED ORDER — AMOXICILLIN-POT CLAVULANATE 875-125 MG PO TABS
1.0000 | ORAL_TABLET | Freq: Once | ORAL | Status: AC
  Administered 2016-06-10: 1 via ORAL
  Filled 2016-06-10: qty 1

## 2016-06-10 MED ORDER — TETANUS-DIPHTH-ACELL PERTUSSIS 5-2.5-18.5 LF-MCG/0.5 IM SUSP
0.5000 mL | Freq: Once | INTRAMUSCULAR | Status: AC
  Administered 2016-06-10: 0.5 mL via INTRAMUSCULAR
  Filled 2016-06-10: qty 0.5

## 2016-06-10 MED ORDER — AMOXICILLIN-POT CLAVULANATE 875-125 MG PO TABS: 1.0000 | ORAL_TABLET | Freq: Two times a day (BID) | ORAL | 0 refills | Status: AC

## 2016-06-10 NOTE — ED Notes (Signed)
Wounds irrigated  

## 2016-06-10 NOTE — ED Notes (Signed)
He was very calm and cooperative as we gave the large I.M. Injections and infiltrated the wounds of distal right forearm/wrist area. He thanks us for our care.

## 2016-06-10 NOTE — ED Provider Notes (Signed)
[facility]-EMERGENCY DEPT Provider Note   CSN: [id number] Arrival date & time: 06/10/16  [id number]     History   Chief Complaint Chief Complaint  Patient presents with  . Animal Bite    HPI Kenneth Oneill is a 56 y.o. male.  HPI  Patient presents after sustaining an animal bite yesterday. Approximately 10 hours ago the patient was trying to intervene in a fight between 2 cats. Patient sustained multiple injuries to his right forearm. Since that time he has had increasing pain, particularly about the dorsum of the arm. Pain is focal, severe, sharp, worse with activity. No new fever, nausea, vomiting. No medication taken for pain control. Patient is unsure of his tetanus status.   Past Medical History:  Diagnosis Date  . Arthritis   . Bipolar disorder (HCC)   . Plantar fasciitis, bilateral   . PTSD (post-traumatic stress disorder)     There are no active problems to display for this patient.   Past Surgical History:  Procedure Laterality Date  . NERVE, TENDON AND ARTERY REPAIR Right 03/17/2014   Procedure: EXPLORE AND REPAIR RIGHT HAND;  Surgeon: Dairl PonderMatthew Weingold, MD;  [facility];  Service: Orthopedics;  Laterality: Right;       Home Medications    Prior to Admission medications   Medication Sig Start Date End Date Taking? Authorizing Provider  amoxicillin-clavulanate (AUGMENTIN) 875-125 MG tablet Take 1 tablet by mouth 2 (two) times daily. 06/10/16 06/24/16  Gerhard Munchobert Moise Friday, MD  ARIPiprazole (ABILIFY) 10 MG tablet Take 10 mg by mouth daily.    Historical Provider, MD  cephALEXin (KEFLEX) 500 MG capsule Take 1 capsule (500 mg total) by mouth 4 (four) times daily. 03/17/14   Loren Raceravid Yelverton, MD  citalopram (CELEXA) 10 MG tablet Take 10 mg by mouth daily.    Historical Provider, MD  cyclobenzaprine (FLEXERIL) 10 MG tablet Take 1 tablet (10 mg total) by mouth at bedtime as needed for muscle spasms. 09/29/12   Adlih Moreno-Coll, MD  HYDROcodone-acetaminophen (NORCO/VICODIN)  5-325 MG tablet Take 1-2 tablets by mouth every 6 (six) hours as needed for severe pain. 06/10/16   Gerhard Munchobert Chani Ghanem, MD  ibuprofen (ADVIL,MOTRIN) 600 MG tablet Take 1 tablet (600 mg total) by mouth 3 (three) times daily after meals. 09/29/12   Adlih Moreno-Coll, MD  Ibuprofen-Diphenhydramine HCl (ADVIL PM) 200-25 MG CAPS Take by mouth as needed (as needed for mild/pain & sleep).    Historical Provider, MD  lidocaine (LIDODERM) 5 % Cut each patch in 4 pieces and apply 1 to 3 pieces over tender area. Remove & Discard patch once an daily. 09/29/12   Adlih Moreno-Coll, MD  oxyCODONE-acetaminophen (ROXICET) 5-325 MG per tablet Take 1 tablet by mouth every 4 (four) hours as needed for severe pain. 03/17/14   Dairl PonderMatthew Weingold, MD    Family History History reviewed. No pertinent family history.  Social History Social History  Substance Use Topics  . Smoking status: Current Every Day Smoker    Packs/day: 0.50    Types: Cigarettes  . Smokeless tobacco: Never Used  . Alcohol use Yes     Comment: occasionally     Allergies   Tramadol   Review of Systems Review of Systems  Constitutional:       Per HPI, otherwise negative  HENT:       Per HPI, otherwise negative  Respiratory:       Per HPI, otherwise negative  Cardiovascular:       Per HPI, otherwise negative  Gastrointestinal: Negative  for vomiting.  Endocrine:       Negative aside from HPI  Genitourinary:       Neg aside from HPI   Musculoskeletal:       Per HPI, otherwise negative  Skin: Positive for color change and wound.  Allergic/Immunologic: Negative for immunocompromised state.  Neurological: Negative for syncope.     Physical Exam Updated Vital Signs BP (!) 143/103   Pulse 95   Temp 98.6 F (37 C) (Oral)   Resp 17   Ht 5\' 8"  (1.727 m)   Wt 158 lb (71.7 kg)   SpO2 95%   BMI 24.02 kg/m   Physical Exam  Constitutional: He is oriented to person, place, and time. He appears well-developed. No distress.  HENT:    Head: Normocephalic and atraumatic.  Eyes: Conjunctivae and EOM are normal.  Cardiovascular: Normal rate and regular rhythm.   Pulmonary/Chest: Effort normal. No stridor. No respiratory distress.  Abdominal: He exhibits no distension.  Musculoskeletal: He exhibits no edema.  Patient has normal wrist flexion, abduction, abduction, limited extension secondary to pain about the dorsum of the wrist. Range of motion of all digits is appropriate, grip strength appropriate  Neurological: He is alert and oriented to person, place, and time.  Patient has preserved sensation in all distributions of distal nerves, gross strength appropriate, range of motion limited secondary to pain, otherwise unremarkable  Skin: Skin is warm and dry.  Multiple punctate wounds on the distal forearm, anterior, posterior, with a 8 cm slightly edematous, erythematous area on the distal dorsal, midway between the ulna and radius, tender to palpation. No active bleeding, purulent drainage from any wound  Psychiatric: He has a normal mood and affect.  Nursing note and vitals reviewed.    ED Treatments / Results   Procedures Procedures (including critical care time)  Medications Ordered in ED Medications  Tdap (BOOSTRIX) injection 0.5 mL (not administered)  amoxicillin-clavulanate (AUGMENTIN) 875-125 MG per tablet 1 tablet (not administered)  HYDROcodone-acetaminophen (NORCO/VICODIN) 5-325 MG per tablet 1 tablet (not administered)     Initial Impression / Assessment and Plan / ED Course  I have reviewed the triage vital signs and the nursing notes.  Pertinent labs & imaging results that were available during my care of the patient were reviewed by me and considered in my medical decision making (see chart for details).  Patient presents after sustaining multiple wounds after trying to intervene in a cat fight. Patient has some limited range of motion secondary to pain, but no evidence for neurovascular  compromise, no evidence for compartment syndrome. No evidence for bacteremia, sepsis. Patient will have wound check in 2 days, was started on antibiotics, had tetanus provided, received analgesia, was discharged in stable condition. Rabies coverage also provided.   Final Clinical Impressions(s) / ED Diagnoses   Final diagnoses:  Cat bite, initial encounter    New Prescriptions New Prescriptions   AMOXICILLIN-CLAVULANATE (AUGMENTIN) 875-125 MG TABLET    Take 1 tablet by mouth 2 (two) times daily.     Gerhard Munch, MD 06/10/16 [phone]

## 2016-06-10 NOTE — Discharge Instructions (Signed)
As discussed, with your identified infection from the cat is very important to have a wound evaluation in 2 days.  Return here for concerning changes if they occur, in the interim.  Please follow the instructions to continue receiving Rabies treatment at the appropriate times.

## 2016-06-10 NOTE — ED Triage Notes (Signed)
Pt reports trying to keep two cats from fighting and got bit by one of them. Pt stated that they were both stray cats. Pt states that cat bit right forearm and since 2200 arm has started to swell and become more painful.

## 2016-12-28 ENCOUNTER — Emergency Department (HOSPITAL_COMMUNITY)
Admission: EM | Admit: 2016-12-28 | Discharge: 2016-12-29 | Disposition: A | Discharge status: Home / Self Care | Payer: Medicaid Other | Attending: Emergency Medicine | Admitting: Emergency Medicine

## 2016-12-28 DIAGNOSIS — R45851 Suicidal ideations: Secondary | ICD-10-CM | POA: Insufficient documentation

## 2016-12-28 DIAGNOSIS — F1414 Cocaine abuse with cocaine-induced mood disorder: Secondary | ICD-10-CM | POA: Diagnosis not present

## 2016-12-28 DIAGNOSIS — F17210 Nicotine dependence, cigarettes, uncomplicated: Secondary | ICD-10-CM | POA: Diagnosis not present

## 2016-12-28 DIAGNOSIS — F1010 Alcohol abuse, uncomplicated: Secondary | ICD-10-CM | POA: Diagnosis present

## 2016-12-28 DIAGNOSIS — Z046 Encounter for general psychiatric examination, requested by authority: Secondary | ICD-10-CM | POA: Diagnosis not present

## 2016-12-28 DIAGNOSIS — F329 Major depressive disorder, single episode, unspecified: Secondary | ICD-10-CM | POA: Diagnosis present

## 2016-12-28 DIAGNOSIS — Z79899 Other long term (current) drug therapy: Secondary | ICD-10-CM | POA: Diagnosis not present

## 2016-12-28 LAB — ACETAMINOPHEN LEVEL: Acetaminophen (Tylenol), Serum: <10 ug/mL — ABNORMAL LOW (ref 10–30)

## 2016-12-28 LAB — RAPID URINE DRUG SCREEN, HOSP PERFORMED
Amphetamines: NOT DETECTED
BARBITURATES: NOT DETECTED
Benzodiazepines: NOT DETECTED
Cocaine: POSITIVE — AB
Opiates: NOT DETECTED
Tetrahydrocannabinol: NOT DETECTED

## 2016-12-28 LAB — ETHANOL: Alcohol, Ethyl (B): 39 mg/dL — ABNORMAL HIGH (ref <5)

## 2016-12-28 LAB — CBC
HCT: 42.2 % (ref 39.0–52.0)
Hemoglobin: 14.4 g/dL (ref 13.0–17.0)
MCH: 32 pg (ref 26.0–34.0)
MCHC: 34.1 g/dL (ref 30.0–36.0)
MCV: 93.8 fL (ref 78.0–100.0)
Platelets: 211 10*3/uL (ref 150–400)
RBC: 4.5 MIL/uL (ref 4.22–5.81)
RDW: 13.1 % (ref 11.5–15.5)
WBC: 7.3 10*3/uL (ref 4.0–10.5)

## 2016-12-28 LAB — COMPREHENSIVE METABOLIC PANEL
ALT: 24 U/L (ref 17–63)
AST: 27 U/L (ref 15–41)
Albumin: 3.7 g/dL (ref 3.5–5.0)
Alkaline Phosphatase: 46 U/L (ref 38–126)
Anion gap: 10 (ref 5–15)
BUN: 13 mg/dL (ref 6–20)
CO2: 25 mmol/L (ref 22–32)
Calcium: 9 mg/dL (ref 8.9–10.3)
Chloride: 107 mmol/L (ref 101–111)
Creatinine, Ser: 1.03 mg/dL (ref 0.61–1.24)
GFR calc Af Amer: >60 mL/min (ref >60)
GFR calc non Af Amer: >60 mL/min (ref >60)
GLUCOSE: 79 mg/dL (ref 65–99)
Potassium: 3.3 mmol/L — ABNORMAL LOW (ref 3.5–5.1)
SODIUM: 142 mmol/L (ref 135–145)
Total Bilirubin: 0.6 mg/dL (ref 0.3–1.2)
Total Protein: 6.6 g/dL (ref 6.5–8.1)

## 2016-12-28 LAB — SALICYLATE LEVEL: Salicylate Lvl: <7 mg/dL (ref 2.8–30.0)

## 2016-12-28 MED ORDER — CITALOPRAM HYDROBROMIDE 10 MG PO TABS
10.0000 mg | ORAL_TABLET | Freq: Every day | ORAL | Status: DC
  Administered 2016-12-29: 10 mg via ORAL
  Filled 2016-12-28: qty 1

## 2016-12-28 MED ORDER — ARIPIPRAZOLE 10 MG PO TABS
10.0000 mg | ORAL_TABLET | Freq: Every day | ORAL | Status: DC
  Administered 2016-12-29: 10 mg via ORAL
  Filled 2016-12-28: qty 1

## 2016-12-28 MED ORDER — TRAZODONE HCL 50 MG PO TABS
50.0000 mg | ORAL_TABLET | Freq: Every day | ORAL | Status: DC
  Administered 2016-12-28: 50 mg via ORAL
  Filled 2016-12-28: qty 1

## 2016-12-28 NOTE — ED Notes (Signed)
[facility] Expected date:  Expected time:  Means of arrival:  Comments: EMS SI/intoxicated

## 2016-12-28 NOTE — ED Triage Notes (Addendum)
Pt here via EMS with thoughts of SI. Pt states he began drinking etoh about 2 hours PTA and then began having increased thoughts of self harm with plan to OD on trazodone. Pt states he has had increased SI x 3 days. Pt states he took 1 100 mg trazodone tablet, however his fiance told EMS he took 4 tablets. Pt denies HI. Pt states he has been having hallucinations of seeing the spirit of his grandfather. Pt states the root of his stress is his fiance. Pt states she brags about making more money than he does and she makes him feel worthless. Pt became tearful while discussing this.

## 2016-12-28 NOTE — ED Provider Notes (Signed)
[facility]-EMER[facility] DEPT Provider Note   CSN: [id number] Arrival date & time: 12/28/16  2127     History   Chief Complaint Chief Complaint  Patient presents with  . Suicidal    HPI Kenneth Oneill is a 56 y.o. male.  HPI atient presents to the emergency room for evaluation of suicidal ideation. Patient states he's been having a lot of issues at home. He does not want to talk about it. Over the last several days he's had increasing thoughts of harming himself by overdosing on pills. He admits to drinking alcohol tonight. According to the EMS report his fiance told EMS that he took 4 tablets.  Past Medical History:  Diagnosis Date  . Arthritis   . Bipolar disorder (HCC)   . Plantar fasciitis, bilateral   . PTSD (post-traumatic stress disorder)     There are no active problems to display for this patient.   Past Surgical History:  Procedure Laterality Date  . NERVE, TENDON AND ARTERY REPAIR Right 03/17/2014   Procedure: EXPLORE AND REPAIR RIGHT HAND;  Surgeon: Dairl Ponder, MD;  Location: [facility] OR;  Service: Orthopedics;  Laterality: Right;       Home Medications    Prior to Admission medications   Medication Sig Start Date End Date Taking? Authorizing Provider  ARIPiprazole (ABILIFY) 10 MG tablet Take 10 mg by mouth daily.   Yes [provider]  citalopram (CELEXA) 10 MG tablet Take 10 mg by mouth daily.   Yes [provider]  GABAPENTIN PO Take 1 capsule by mouth daily.   Yes [provider]  traZODone (DESYREL) 100 MG tablet Take 50 mg by mouth at bedtime.   Yes [provider]  cephALEXin (KEFLEX) 500 MG capsule Take 1 capsule (500 mg total) by mouth 4 (four) times daily. Patient not taking: Reported on 12/28/2016 03/17/14   Loren Racer, MD  cyclobenzaprine (FLEXERIL) 10 MG tablet Take 1 tablet (10 mg total) by mouth at bedtime as needed for muscle spasms. Patient not taking: Reported on 12/28/2016 09/29/12   Moreno-Coll, Adlih, MD    HYDROcodone-acetaminophen (NORCO/VICODIN) 5-325 MG tablet Take 1-2 tablets by mouth every 6 (six) hours as needed for severe pain. Patient not taking: Reported on 12/28/2016 06/10/16   Gerhard Munch, MD  ibuprofen (ADVIL,MOTRIN) 600 MG tablet Take 1 tablet (600 mg total) by mouth 3 (three) times daily after meals. Patient not taking: Reported on 12/28/2016 09/29/12   Moreno-Coll, Adlih, MD  lidocaine (LIDODERM) 5 % Cut each patch in 4 pieces and apply 1 to 3 pieces over tender area. Remove & Discard patch once an daily. Patient not taking: Reported on 12/28/2016 09/29/12   Moreno-Coll, Adlih, MD  oxyCODONE-acetaminophen (ROXICET) 5-325 MG per tablet Take 1 tablet by mouth every 4 (four) hours as needed for severe pain. Patient not taking: Reported on 12/28/2016 03/17/14   Dairl Ponder, MD    Family History No family history on file.  Social History Social History  Substance Use Topics  . Smoking status: Current Every Day Smoker    Packs/day: 0.50    Types: Cigarettes  . Smokeless tobacco: Never Used  . Alcohol use Yes     Comment: occasionally     Allergies   Patient has no known allergies.   Review of Systems Review of Systems  All other systems reviewed and are negative.    Physical Exam Updated Vital Signs BP (!) 123/96 (BP Location: Left Arm)   Pulse 77   Temp 98.7 F (37.1  C) (Oral)   Resp 18   SpO2 100%   Physical Exam  Constitutional: He appears well-developed and well-nourished. No distress.  HENT:  Head: Normocephalic and atraumatic.  Right Ear: External ear normal.  Left Ear: External ear normal.  Eyes: Conjunctivae are normal. Right eye exhibits no discharge. Left eye exhibits no discharge. No scleral icterus.  Neck: Neck supple. No tracheal deviation present.  Cardiovascular: Normal rate.   Pulmonary/Chest: Effort normal. No stridor. No respiratory distress.  Abdominal: He exhibits no distension.  Musculoskeletal: He exhibits no edema.  Neurological:  He is alert. Cranial nerve deficit: no gross deficits.  Skin: Skin is warm and dry. No rash noted.  Psychiatric: He exhibits a depressed mood.  Nursing note and vitals reviewed.    ED Treatments / Results  Labs (all labs ordered are listed, but only abnormal results are displayed) Labs Reviewed  COMPREHENSIVE METABOLIC PANEL - Abnormal; Notable for the following:       Result Value   Potassium 3.3 (*)    All other components within normal limits  ETHANOL - Abnormal; Notable for the following:    Alcohol, Ethyl (B) 39 (*)    All other components within normal limits  ACETAMINOPHEN LEVEL - Abnormal; Notable for the following:    Acetaminophen (Tylenol), Serum <10 (*)    All other components within normal limits  RAPID URINE DRUG SCREEN, HOSP PERFORMED - Abnormal; Notable for the following:    Cocaine POSITIVE (*)    All other components within normal limits  SALICYLATE LEVEL  CBC    Radiology No results found.  Procedures Procedures (including critical care time)  Medications Ordered in ED Medications  ARIPiprazole (ABILIFY) tablet 10 mg (not administered)  citalopram (CELEXA) tablet 10 mg (not administered)  traZODone (DESYREL) tablet 50 mg (not administered)     Initial Impression / Assessment and Plan / ED Course  I have reviewed the triage vital signs and the nursing notes.  Pertinent labs & imaging results that were available during my care of the patient were reviewed by me and considered in my medical decision making (see chart for details).  Patient presents to the emergency room for evaluation of suicidal ideation. Patient appears medically stable. Laboratory tests are reassuring. Medically clear for psychiatric evaluation  Final Clinical Impressions(s) / ED Diagnoses   Final diagnoses:  Suicidal ideation    New Prescriptions New Prescriptions   No medications on file     Linwood DibblesKnapp, Lionel Woodberry, MD 12/28/16 2311
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
## 2016-12-28 NOTE — ED Notes (Signed)
Bed: [facility]31 Expected date:  Expected time:  Means of arrival:  Comments: 

## 2016-12-29 DIAGNOSIS — F17210 Nicotine dependence, cigarettes, uncomplicated: Secondary | ICD-10-CM

## 2016-12-29 DIAGNOSIS — Z79899 Other long term (current) drug therapy: Secondary | ICD-10-CM

## 2016-12-29 DIAGNOSIS — G479 Sleep disorder, unspecified: Secondary | ICD-10-CM

## 2016-12-29 DIAGNOSIS — F1010 Alcohol abuse, uncomplicated: Secondary | ICD-10-CM | POA: Diagnosis present

## 2016-12-29 DIAGNOSIS — F1414 Cocaine abuse with cocaine-induced mood disorder: Secondary | ICD-10-CM | POA: Diagnosis present

## 2016-12-29 NOTE — ED Notes (Signed)
[facility] Expected date:  Expected time:  Means of arrival:  Comments: [place]

## 2016-12-29 NOTE — ED Notes (Signed)
Pt to have psych eval in AM with psychiatrist.

## 2016-12-29 NOTE — ED Notes (Signed)
SBAR Report received from previous nurse. Pt received calm and visible on unit. Pt denies current SI/ HI, A/V H, depression, anxiety, or pain at this time, and appears otherwise stable and free of distress but covered his head with blanket durring assessment. Pt reminded of camera surveillance, q 15 min rounds, and rules of the milieu. Will continue to assess.

## 2016-12-29 NOTE — ED Notes (Signed)
TTS completed pt remains stable and returned to sleeping

## 2016-12-29 NOTE — BH Assessment (Signed)
[facility] Assessment Progress Note  Per Thedore MinsMojeed Akintayo, MD, this pt does not require psychiatric hospitalization at this time.  [facility].  This has been included in pt's discharge instructions.  Pt's nurse, Lincoln MaxinOlivette, has been notified.  Doylene Canninghomas Neasia Fleeman, MA Triage Specialist [phone]

## 2016-12-29 NOTE — Discharge Instructions (Signed)
For your behavioral health needs, you are advised to follow up with the [facility]: ° °     [facility] °     [address] °     [place], [place] [address] °     [phone] °

## 2016-12-29 NOTE — Consult Note (Signed)
[facility] Psychiatry Consult   Reason for Consult:  Cocaine abuse with suicidal ideatiosn Referring Physician:  EDP Patient Identification: Kenneth Oneill MRN:  [id number] Principal Diagnosis: Cocaine abuse with cocaine-induced mood disorder [facility]) Diagnosis:   Patient Active Problem List   Diagnosis Date Noted  . Cocaine abuse with cocaine-induced mood disorder ([place]) [F14.14] 12/29/2016    Priority: High  . Alcohol abuse [F10.10] 12/29/2016    Priority: High    Total Time spent with patient: 45 minutes  Subjective:   Kenneth Oneill is a 56 y.o. male patient requests to discharge, stable.  HPI:  56 yo male who presented to the ED with alcohol and cocaine abuse with suicidal ideations.  On assessment, he was irritable but not suicidal.  Upset we were asking questions and even more upset when alcohol was mentioned.  He requested to be discharged as he did not want to be here.  No suicidal/homicidal ideations, hallucinations, or withdrawal symptoms noted.  Stable for discharge.  Past Psychiatric History: substance abuse  Risk to Self: None Risk to Others: Homicidal Ideation: No Thoughts of Harm to Others: No Current Homicidal Intent: No Current Homicidal Plan: No Access to Homicidal Means: No Identified Victim: No one History of harm to others?: No Assessment of Violence: None Noted Violent Behavior Description: None reported Does patient have access to weapons?: No Criminal Charges Pending?: No Does patient have a court date: No Prior Inpatient Therapy: Prior Inpatient Therapy: Yes Prior Therapy Dates: Years ago [facility] [place] Reason for Treatment: depression Prior Outpatient Therapy: Prior Outpatient Therapy: Yes Prior Therapy Dates: Last few years [facility] [place] Reason for Treatment: medication management Does patient have an ACCT team?: No Does patient have Intensive In-House Services?  :  No Does patient have [facility] services? : No Does patient have P4CC services?: No  Past Medical History:  Past Medical History:  Diagnosis Date  . Arthritis   . Bipolar disorder ([place])   . Plantar fasciitis, bilateral   . PTSD (post-traumatic stress disorder)     Past Surgical History:  Procedure Laterality Date  . NERVE, TENDON AND ARTERY REPAIR Right 03/17/2014   Procedure: EXPLORE AND REPAIR RIGHT HAND;  Surgeon: Charlotte Crumb, MD;  Location: [facility];  Service: Orthopedics;  Laterality: Right;   Family History: No family history on file. Family Psychiatric  History: unknown Social History:  History  Alcohol Use  . Yes    Comment: occasionally     History  Drug use: Unknown    Social History   Social History  . Marital status: Single    Spouse name: N/A  . Number of children: N/A  . Years of education: N/A   Social History Main Topics  . Smoking status: Current Every Day Smoker    Packs/day: 0.50    Types: Cigarettes  . Smokeless tobacco: Never Used  . Alcohol use Yes     Comment: occasionally  . Drug use: Unknown  . Sexual activity: Not on file   Other Topics Concern  . Not on file   Social History Narrative  . No narrative on file   Additional Social History:    Allergies:  No Known Allergies  Labs:  Results for orders placed or performed during the hospital encounter of 12/28/16 (from the past 48 hour(s))  Comprehensive metabolic panel     Status: Abnormal   Collection Time: 12/28/16  9:54 PM  Result Value Ref Range   Sodium 142 135 -  145 mmol/L   Potassium 3.3 (L) 3.5 - 5.1 mmol/L   Chloride 107 101 - 111 mmol/L   CO2 25 22 - 32 mmol/L   Glucose, Bld 79 65 - 99 mg/dL   BUN 13 6 - 20 mg/dL   Creatinine, Ser 1.03 0.61 - 1.24 mg/dL   Calcium 9.0 8.9 - 10.3 mg/dL   Total Protein 6.6 6.5 - 8.1 g/dL   Albumin 3.7 3.5 - 5.0 g/dL   AST 27 15 - 41 U/L   ALT 24 17 - 63 U/L   Alkaline Phosphatase 46 38 - 126 U/L   Total Bilirubin 0.6 0.3 - 1.2  mg/dL   GFR calc non Af Amer >60 >60 mL/min   GFR calc Af Amer >60 >60 mL/min    Comment: (NOTE) The eGFR has been calculated using the CKD EPI equation. This calculation has not been validated in all clinical situations. eGFR's persistently <60 mL/min signify possible Chronic Kidney Disease.    Anion gap 10 5 - 15  Ethanol     Status: Abnormal   Collection Time: 12/28/16  9:54 PM  Result Value Ref Range   Alcohol, Ethyl (B) 39 (H) <5 mg/dL    Comment:        LOWEST DETECTABLE LIMIT FOR SERUM ALCOHOL IS 5 mg/dL FOR MEDICAL PURPOSES ONLY   Salicylate level     Status: None   Collection Time: 12/28/16  9:54 PM  Result Value Ref Range   Salicylate Lvl <8.1 2.8 - 30.0 mg/dL  Acetaminophen level     Status: Abnormal   Collection Time: 12/28/16  9:54 PM  Result Value Ref Range   Acetaminophen (Tylenol), Serum <10 (L) 10 - 30 ug/mL    Comment:        THERAPEUTIC CONCENTRATIONS VARY SIGNIFICANTLY. A RANGE OF 10-30 ug/mL MAY BE AN EFFECTIVE CONCENTRATION FOR MANY PATIENTS. HOWEVER, SOME ARE BEST TREATED AT CONCENTRATIONS OUTSIDE THIS RANGE. ACETAMINOPHEN CONCENTRATIONS >150 ug/mL AT 4 HOURS AFTER INGESTION AND >50 ug/mL AT 12 HOURS AFTER INGESTION ARE OFTEN ASSOCIATED WITH TOXIC REACTIONS.   cbc     Status: None   Collection Time: 12/28/16  9:54 PM  Result Value Ref Range   WBC 7.3 4.0 - 10.5 K/uL   RBC 4.50 4.22 - 5.81 MIL/uL   Hemoglobin 14.4 13.0 - 17.0 g/dL   HCT 42.2 39.0 - 52.0 %   MCV 93.8 78.0 - 100.0 fL   MCH 32.0 26.0 - 34.0 pg   MCHC 34.1 30.0 - 36.0 g/dL   RDW 13.1 11.5 - 15.5 %   Platelets 211 150 - 400 K/uL  Rapid urine drug screen (hospital performed)     Status: Abnormal   Collection Time: 12/28/16  9:55 PM  Result Value Ref Range   Opiates NONE DETECTED NONE DETECTED   Cocaine POSITIVE (A) NONE DETECTED   Benzodiazepines NONE DETECTED NONE DETECTED   Amphetamines NONE DETECTED NONE DETECTED   Tetrahydrocannabinol NONE DETECTED NONE DETECTED    Barbiturates NONE DETECTED NONE DETECTED    Comment:        DRUG SCREEN FOR MEDICAL PURPOSES ONLY.  IF CONFIRMATION IS NEEDED FOR ANY PURPOSE, NOTIFY LAB WITHIN 5 DAYS.        LOWEST DETECTABLE LIMITS FOR URINE DRUG SCREEN Drug Class       Cutoff (ng/mL) Amphetamine      1000 Barbiturate      200 Benzodiazepine   771 Tricyclics       165 Opiates  300 Cocaine          300 THC              50     Current Facility-Administered Medications  Medication Dose Route Frequency Provider Last Rate Last Dose  . ARIPiprazole (ABILIFY) tablet 10 mg  10 mg Oral Daily Dorie Rank, MD   10 mg at 12/29/16 1014  . citalopram (CELEXA) tablet 10 mg  10 mg Oral Daily Dorie Rank, MD   10 mg at 12/29/16 1014  . traZODone (DESYREL) tablet 50 mg  50 mg Oral QHS Dorie Rank, MD   50 mg at 12/28/16 2326   Current Outpatient Prescriptions  Medication Sig Dispense Refill  . ARIPiprazole (ABILIFY) 10 MG tablet Take 10 mg by mouth daily.    . citalopram (CELEXA) 10 MG tablet Take 10 mg by mouth daily.    Marland Kitchen GABAPENTIN PO Take 1 capsule by mouth daily.    . traZODone (DESYREL) 100 MG tablet Take 50 mg by mouth at bedtime.    . cephALEXin (KEFLEX) 500 MG capsule Take 1 capsule (500 mg total) by mouth 4 (four) times daily. (Patient not taking: Reported on 12/28/2016) 28 capsule 0  . cyclobenzaprine (FLEXERIL) 10 MG tablet Take 1 tablet (10 mg total) by mouth at bedtime as needed for muscle spasms. (Patient not taking: Reported on 12/28/2016) 15 tablet 0  . HYDROcodone-acetaminophen (NORCO/VICODIN) 5-325 MG tablet Take 1-2 tablets by mouth every 6 (six) hours as needed for severe pain. (Patient not taking: Reported on 12/28/2016) 15 tablet 0  . ibuprofen (ADVIL,MOTRIN) 600 MG tablet Take 1 tablet (600 mg total) by mouth 3 (three) times daily after meals. (Patient not taking: Reported on 12/28/2016) 21 tablet 0  . lidocaine (LIDODERM) 5 % Cut each patch in 4 pieces and apply 1 to 3 pieces over tender area. Remove &  Discard patch once an daily. (Patient not taking: Reported on 12/28/2016) 6 patch 0  . oxyCODONE-acetaminophen (ROXICET) 5-325 MG per tablet Take 1 tablet by mouth every 4 (four) hours as needed for severe pain. (Patient not taking: Reported on 12/28/2016) 30 tablet 0    Musculoskeletal: Strength & Muscle Tone: within normal limits Gait & Station: normal Patient leans: N/A  Psychiatric Specialty Exam: Physical Exam  Constitutional: He is oriented to person, place, and time. He appears well-developed and well-nourished.  HENT:  Head: Normocephalic.  Neck: Normal range of motion.  Respiratory: Effort normal.  Musculoskeletal: Normal range of motion.  Neurological: He is alert and oriented to person, place, and time.  Psychiatric: He has a normal mood and affect. His speech is normal and behavior is normal. Judgment and thought content normal. Cognition and memory are normal.    Review of Systems  Psychiatric/Behavioral: Positive for substance abuse.  All other systems reviewed and are negative.   Blood pressure 117/82, pulse 71, temperature 98.3 F (36.8 C), temperature source Oral, resp. rate 16, SpO2 97 %.There is no height or weight on file to calculate BMI.  General Appearance: Casual  Eye Contact:  Good  Speech:  Normal Rate  Volume:  Normal  Mood:  Irritable  Affect:  Congruent  Thought Process:  Coherent and Descriptions of Associations: Intact  Orientation:  Full (Time, Place, and Person)  Thought Content:  WDL and Logical  Suicidal Thoughts:  No  Homicidal Thoughts:  No  Memory:  Immediate;   Good Recent;   Good Remote;   Good  Judgement:  Fair  Insight:  Fair  Psychomotor Activity:  Normal  Concentration:  Concentration: Good and Attention Span: Good  Recall:  Good  Fund of Knowledge:  Good  Language:  Good  Akathisia:  No  Handed:  Right  AIMS (if indicated):     Assets:  Housing Intimacy Leisure Time Physical Health Resilience Social Support  ADL's:   Intact  Cognition:  WNL  Sleep:        Treatment Plan Summary: Daily contact with patient to assess and evaluate symptoms and progress in treatment, Medication management and Plan cocaine induced mood disorder:  -Crisis stabilization -Medication management:  Restarted his Abilify 10 mg daily for mood stabilization along with Celexa 10 mg daily for depression and Trazodone 50 mg at bedtime for sleep -Individual and substance abuse counseling -Outpatient resources provided  Disposition: No evidence of imminent risk to self or others at present.    Waylan Boga, NP 12/29/2016 10:35 AM  Patient seen face-to-face for psychiatric evaluation, chart reviewed and case discussed with the physician extender and developed treatment plan. Reviewed the information documented and agree with the treatment plan. Corena Pilgrim, MD

## 2016-12-29 NOTE — [facility] Suicide Risk Assessment (Signed)
Suicide Risk Assessment  Discharge Assessment   [facility]   Principal Problem: Cocaine abuse with cocaine-induced mood disorder [facility](HCC) Discharge Diagnoses:  Patient Active Problem List   Diagnosis Date Noted  . Cocaine abuse with cocaine-induced mood disorder (HCC) [F14.14] 12/29/2016    Priority: High  . Alcohol abuse [F10.10] 12/29/2016    Priority: High    Total Time spent with patient: 45 minutes  Musculoskeletal: Strength & Muscle Tone: within normal limits Gait & Station: normal Patient leans: N/A  Psychiatric Specialty Exam: Physical Exam  Constitutional: He is oriented to person, place, and time. He appears well-developed and well-nourished.  HENT:  Head: Normocephalic.  Neck: Normal range of motion.  Respiratory: Effort normal.  Musculoskeletal: Normal range of motion.  Neurological: He is alert and oriented to person, place, and time.  Psychiatric: He has a normal mood and affect. His speech is normal and behavior is normal. Judgment and thought content normal. Cognition and memory are normal.    Review of Systems  Psychiatric/Behavioral: Positive for substance abuse.  All other systems reviewed and are negative.   Blood pressure 117/82, pulse 71, temperature 98.3 F (36.8 C), temperature source Oral, resp. rate 16, SpO2 97 %.There is no height or weight on file to calculate BMI.  General Appearance: Casual  Eye Contact:  Good  Speech:  Normal Rate  Volume:  Normal  Mood:  Irritable  Affect:  Congruent  Thought Process:  Coherent and Descriptions of Associations: Intact  Orientation:  Full (Time, Place, and Person)  Thought Content:  WDL and Logical  Suicidal Thoughts:  No  Homicidal Thoughts:  No  Memory:  Immediate;   Good Recent;   Good Remote;   Good  Judgement:  Fair  Insight:  Fair  Psychomotor Activity:  Normal  Concentration:  Concentration: Good and Attention Span: Good  Recall:  Good  Fund of Knowledge:  Good   Language:  Good  Akathisia:  No  Handed:  Right  AIMS (if indicated):     Assets:  Housing Intimacy Leisure Time Physical Health Resilience Social Support  ADL's:  Intact  Cognition:  WNL  Sleep:       Mental Status Per Nursing Assessment::   On Admission:   alcohol and cocaine abuse with suicidal ideations  Demographic Factors:  Male  Loss Factors: NA  Historical Factors: NA  Risk Reduction Factors:   Sense of responsibility to family, Living with another person, especially a relative and Positive social support  Continued Clinical Symptoms:  Irritability  Cognitive Features That Contribute To Risk:  None    Suicide Risk:  Minimal: No identifiable suicidal ideation.  Patients presenting with no risk factors but with morbid ruminations; may be classified as minimal risk based on the severity of the depressive symptoms    Plan Of Care/Follow-up recommendations:  Activity:  as tolerated Diet:  heart healthy diet  Domonique Cothran, NP 12/29/2016, 10:47 AM

## 2017-02-03 ENCOUNTER — Emergency Department (HOSPITAL_COMMUNITY): Payer: Medicaid Other

## 2017-02-03 ENCOUNTER — Inpatient Hospital Stay (HOSPITAL_COMMUNITY): Payer: Medicaid Other

## 2017-02-03 ENCOUNTER — Inpatient Hospital Stay (HOSPITAL_COMMUNITY)
Admission: EM | Admit: 2017-02-03 | Discharge: 2017-02-05 | DRG: 084 | Disposition: A | Discharge status: Home / Self Care | Payer: Medicaid Other | Attending: General Surgery | Admitting: General Surgery

## 2017-02-03 ENCOUNTER — Encounter (HOSPITAL_COMMUNITY): Payer: Self-pay | Admitting: Emergency Medicine

## 2017-02-03 DIAGNOSIS — S90414A Abrasion, right lesser toe(s), initial encounter: Secondary | ICD-10-CM | POA: Diagnosis present

## 2017-02-03 DIAGNOSIS — S80211A Abrasion, right knee, initial encounter: Secondary | ICD-10-CM | POA: Diagnosis present

## 2017-02-03 DIAGNOSIS — S42024A Nondisplaced fracture of shaft of right clavicle, initial encounter for closed fracture: Secondary | ICD-10-CM | POA: Diagnosis present

## 2017-02-03 DIAGNOSIS — F1410 Cocaine abuse, uncomplicated: Secondary | ICD-10-CM | POA: Diagnosis present

## 2017-02-03 DIAGNOSIS — F319 Bipolar disorder, unspecified: Secondary | ICD-10-CM | POA: Diagnosis present

## 2017-02-03 DIAGNOSIS — R402142 Coma scale, eyes open, spontaneous, at arrival to emergency department: Secondary | ICD-10-CM | POA: Diagnosis present

## 2017-02-03 DIAGNOSIS — S80212A Abrasion, left knee, initial encounter: Secondary | ICD-10-CM | POA: Diagnosis present

## 2017-02-03 DIAGNOSIS — S0291XA Unspecified fracture of skull, initial encounter for closed fracture: Secondary | ICD-10-CM | POA: Diagnosis present

## 2017-02-03 DIAGNOSIS — H9210 Otorrhea, unspecified ear: Secondary | ICD-10-CM | POA: Diagnosis present

## 2017-02-03 DIAGNOSIS — S92525A Nondisplaced fracture of medial phalanx of left lesser toe(s), initial encounter for closed fracture: Secondary | ICD-10-CM | POA: Diagnosis present

## 2017-02-03 DIAGNOSIS — S42001A Fracture of unspecified part of right clavicle, initial encounter for closed fracture: Secondary | ICD-10-CM

## 2017-02-03 DIAGNOSIS — F17210 Nicotine dependence, cigarettes, uncomplicated: Secondary | ICD-10-CM | POA: Diagnosis present

## 2017-02-03 DIAGNOSIS — F1010 Alcohol abuse, uncomplicated: Secondary | ICD-10-CM | POA: Diagnosis present

## 2017-02-03 DIAGNOSIS — R402362 Coma scale, best motor response, obeys commands, at arrival to emergency department: Secondary | ICD-10-CM | POA: Diagnosis present

## 2017-02-03 DIAGNOSIS — S066X9A Traumatic subarachnoid hemorrhage with loss of consciousness of unspecified duration, initial encounter: Secondary | ICD-10-CM | POA: Diagnosis present

## 2017-02-03 DIAGNOSIS — R402252 Coma scale, best verbal response, oriented, at arrival to emergency department: Secondary | ICD-10-CM | POA: Diagnosis present

## 2017-02-03 DIAGNOSIS — S0219XA Other fracture of base of skull, initial encounter for closed fracture: Secondary | ICD-10-CM | POA: Diagnosis present

## 2017-02-03 DIAGNOSIS — S42009A Fracture of unspecified part of unspecified clavicle, initial encounter for closed fracture: Secondary | ICD-10-CM

## 2017-02-03 DIAGNOSIS — S92525B Nondisplaced fracture of medial phalanx of left lesser toe(s), initial encounter for open fracture: Secondary | ICD-10-CM | POA: Diagnosis present

## 2017-02-03 DIAGNOSIS — S20411A Abrasion of right back wall of thorax, initial encounter: Secondary | ICD-10-CM | POA: Diagnosis present

## 2017-02-03 LAB — RAPID URINE DRUG SCREEN, HOSP PERFORMED
Amphetamines: NOT DETECTED
Barbiturates: NOT DETECTED
Benzodiazepines: NOT DETECTED
Cocaine: POSITIVE — AB
Opiates: NOT DETECTED
Tetrahydrocannabinol: NOT DETECTED

## 2017-02-03 LAB — URINALYSIS, ROUTINE W REFLEX MICROSCOPIC
BILIRUBIN URINE: NEGATIVE
Bacteria, UA: NONE SEEN
GLUCOSE, UA: NEGATIVE mg/dL
KETONES UR: 20 mg/dL — AB
Leukocytes, UA: NEGATIVE
NITRITE: NEGATIVE
PROTEIN: 30 mg/dL — AB
Specific Gravity, Urine: 1.01 (ref 1.005–1.030)
Squamous Epithelial / LPF: NONE SEEN
pH: 5 (ref 5.0–8.0)

## 2017-02-03 LAB — COMPREHENSIVE METABOLIC PANEL
ALT: 29 U/L (ref 17–63)
AST: 60 U/L — AB (ref 15–41)
Albumin: 3.9 g/dL (ref 3.5–5.0)
Alkaline Phosphatase: 56 U/L (ref 38–126)
Anion gap: 16 — ABNORMAL HIGH (ref 5–15)
BILIRUBIN TOTAL: 1 mg/dL (ref 0.3–1.2)
BUN: 13 mg/dL (ref 6–20)
CO2: 16 mmol/L — ABNORMAL LOW (ref 22–32)
CREATININE: 0.86 mg/dL (ref 0.61–1.24)
Calcium: 8.4 mg/dL — ABNORMAL LOW (ref 8.9–10.3)
Chloride: 103 mmol/L (ref 101–111)
Glucose, Bld: 65 mg/dL (ref 65–99)
POTASSIUM: 3.6 mmol/L (ref 3.5–5.1)
Sodium: 135 mmol/L (ref 135–145)
TOTAL PROTEIN: 7.2 g/dL (ref 6.5–8.1)

## 2017-02-03 LAB — CBC
HCT: 43.9 % (ref 39.0–52.0)
Hemoglobin: 14.4 g/dL (ref 13.0–17.0)
MCH: 30.9 pg (ref 26.0–34.0)
MCHC: 32.8 g/dL (ref 30.0–36.0)
MCV: 94.2 fL (ref 78.0–100.0)
PLATELETS: 220 10*3/uL (ref 150–400)
RBC: 4.66 MIL/uL (ref 4.22–5.81)
RDW: 13.3 % (ref 11.5–15.5)
WBC: 13.8 10*3/uL — AB (ref 4.0–10.5)

## 2017-02-03 LAB — SALICYLATE LEVEL

## 2017-02-03 LAB — ETHANOL: ALCOHOL ETHYL (B): 117 mg/dL — AB (ref <10)

## 2017-02-03 LAB — ACETAMINOPHEN LEVEL: Acetaminophen (Tylenol), Serum: <10 ug/mL — ABNORMAL LOW (ref 10–30)

## 2017-02-03 MED ORDER — CIPROFLOXACIN-DEXAMETHASONE 0.3-0.1 % OT SUSP
4.0000 [drp] | Freq: Two times a day (BID) | OTIC | Status: DC
  Administered 2017-02-04 – 2017-02-05 (×4): 4 [drp] via OTIC
  Filled 2017-02-03: qty 7.5

## 2017-02-03 MED ORDER — VITAMIN B-1 100 MG PO TABS
100.0000 mg | ORAL_TABLET | Freq: Every day | ORAL | Status: DC
  Administered 2017-02-04 – 2017-02-05 (×2): 100 mg via ORAL
  Filled 2017-02-03 (×2): qty 1

## 2017-02-03 MED ORDER — LORAZEPAM 2 MG/ML IJ SOLN
0.0000 mg | Freq: Four times a day (QID) | INTRAMUSCULAR | Status: DC
  Administered 2017-02-03: 2 mg via INTRAVENOUS
  Filled 2017-02-03: qty 1

## 2017-02-03 MED ORDER — LORAZEPAM 2 MG/ML IJ SOLN: 0.0000 mg | Freq: Two times a day (BID) | INTRAMUSCULAR | Status: DC

## 2017-02-03 MED ORDER — FENTANYL CITRATE (PF) 100 MCG/2ML IJ SOLN
50.0000 ug | Freq: Once | INTRAMUSCULAR | Status: AC
  Administered 2017-02-03: 50 ug via INTRAVENOUS
  Filled 2017-02-03: qty 2

## 2017-02-03 MED ORDER — LORAZEPAM 1 MG PO TABS
0.0000 mg | ORAL_TABLET | Freq: Four times a day (QID) | ORAL | Status: DC
  Administered 2017-02-05: 2 mg via ORAL
  Filled 2017-02-03: qty 2

## 2017-02-03 MED ORDER — LORAZEPAM 1 MG PO TABS
0.0000 mg | ORAL_TABLET | Freq: Two times a day (BID) | ORAL | Status: DC
Start: 2017-02-06 — End: 2017-02-05

## 2017-02-03 MED ORDER — THIAMINE HCL 100 MG/ML IJ SOLN: 100.0000 mg | Freq: Every day | INTRAMUSCULAR | Status: DC

## 2017-02-03 MED ORDER — DEXTROSE 5 % IV SOLN
1.0000 g | Freq: Once | INTRAVENOUS | Status: AC
  Administered 2017-02-04: 1 g via INTRAVENOUS
  Filled 2017-02-03: qty 10
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
## 2017-02-03 NOTE — Consult Note (Signed)
Consult acknowledged.  Orthopedic consultation placed for segmental right clavicle fracture.  Per the emergency department staff, this is a closed injury.  The patient's upper extremity is neurovascularly intact.  I recommend sling, nonweightbearing.  Full consult note to follow.
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
## 2017-02-04 DIAGNOSIS — S42024A Nondisplaced fracture of shaft of right clavicle, initial encounter for closed fracture: Secondary | ICD-10-CM | POA: Diagnosis present

## 2017-02-04 DIAGNOSIS — S92525B Nondisplaced fracture of medial phalanx of left lesser toe(s), initial encounter for open fracture: Secondary | ICD-10-CM | POA: Diagnosis present

## 2017-02-04 LAB — BASIC METABOLIC PANEL
Anion gap: 16 — ABNORMAL HIGH (ref 5–15)
BUN: 14 mg/dL (ref 6–20)
CALCIUM: 8.8 mg/dL — AB (ref 8.9–10.3)
CO2: 19 mmol/L — AB (ref 22–32)
Chloride: 103 mmol/L (ref 101–111)
Creatinine, Ser: 1.04 mg/dL (ref 0.61–1.24)
GFR calc Af Amer: >60 mL/min (ref >60)
GLUCOSE: 111 mg/dL — AB (ref 65–99)
POTASSIUM: 4.1 mmol/L (ref 3.5–5.1)
Sodium: 138 mmol/L (ref 135–145)

## 2017-02-04 LAB — CBC
HEMATOCRIT: 43.8 % (ref 39.0–52.0)
Hemoglobin: 14.5 g/dL (ref 13.0–17.0)
MCH: 31.4 pg (ref 26.0–34.0)
MCHC: 33.1 g/dL (ref 30.0–36.0)
MCV: 94.8 fL (ref 78.0–100.0)
Platelets: 223 10*3/uL (ref 150–400)
RBC: 4.62 MIL/uL (ref 4.22–5.81)
RDW: 13.1 % (ref 11.5–15.5)
WBC: 9.9 10*3/uL (ref 4.0–10.5)

## 2017-02-04 LAB — MRSA PCR SCREENING: MRSA by PCR: NEGATIVE

## 2017-02-04 LAB — HIV ANTIBODY (ROUTINE TESTING W REFLEX): HIV Screen 4th Generation wRfx: NONREACTIVE

## 2017-02-04 MED ORDER — POTASSIUM CHLORIDE IN NACL 20-0.9 MEQ/L-% IV SOLN
INTRAVENOUS | Status: DC
  Administered 2017-02-04 (×2): via INTRAVENOUS
  Filled 2017-02-04 (×3): qty 1000

## 2017-02-04 MED ORDER — DOCUSATE SODIUM 100 MG PO CAPS
100.0000 mg | ORAL_CAPSULE | Freq: Two times a day (BID) | ORAL | Status: DC
  Administered 2017-02-04 – 2017-02-05 (×3): 100 mg via ORAL
  Filled 2017-02-04 (×3): qty 1

## 2017-02-04 MED ORDER — PANTOPRAZOLE SODIUM 40 MG IV SOLR: 40.0000 mg | Freq: Every day | INTRAVENOUS | Status: DC

## 2017-02-04 MED ORDER — ONDANSETRON 4 MG PO TBDP: 4.0000 mg | ORAL_TABLET | Freq: Four times a day (QID) | ORAL | Status: DC | PRN

## 2017-02-04 MED ORDER — MORPHINE SULFATE (PF) 4 MG/ML IV SOLN: 1.0000 mg | INTRAVENOUS | Status: DC | PRN

## 2017-02-04 MED ORDER — PANTOPRAZOLE SODIUM 40 MG PO TBEC
40.0000 mg | DELAYED_RELEASE_TABLET | Freq: Every day | ORAL | Status: DC
  Administered 2017-02-04 – 2017-02-05 (×2): 40 mg via ORAL
  Filled 2017-02-04 (×2): qty 1

## 2017-02-04 MED ORDER — DOCUSATE SODIUM 100 MG PO CAPS: 100.0000 mg | ORAL_CAPSULE | Freq: Two times a day (BID) | ORAL | Status: DC

## 2017-02-04 MED ORDER — ACETAMINOPHEN 325 MG PO TABS
650.0000 mg | ORAL_TABLET | ORAL | Status: DC | PRN
  Administered 2017-02-04 (×2): 650 mg via ORAL
  Filled 2017-02-04 (×2): qty 2

## 2017-02-04 MED ORDER — TRAMADOL HCL 50 MG PO TABS
50.0000 mg | ORAL_TABLET | Freq: Four times a day (QID) | ORAL | Status: DC | PRN
  Administered 2017-02-04 – 2017-02-05 (×2): 50 mg via ORAL
  Filled 2017-02-04 (×2): qty 1

## 2017-02-04 MED ORDER — ONDANSETRON HCL 4 MG/2ML IJ SOLN: 4.0000 mg | Freq: Four times a day (QID) | INTRAMUSCULAR | Status: DC | PRN

## 2017-02-04 NOTE — Progress Notes (Signed)
Patient ID: Kenneth Oneill, male   DOB: 08-13-1960, 56 y.o.   MRN: [id number] Pt denies headache or NTW. R arm in sling. Head CT reviewed...very minimal if any tSAH in L sylvian fissure, R temporal fx is non-displaced. No neurosurgical intervention necessary. Spoke with family about how to watch for otorrhea. Let me know if I can help in any way
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
## 2017-02-05 MED ORDER — TRAZODONE HCL 100 MG PO TABS: 100.0000 mg | ORAL_TABLET | Freq: Every day | ORAL | Status: DC

## 2017-02-05 MED ORDER — TRAMADOL HCL 50 MG PO TABS: 50.0000 mg | ORAL_TABLET | Freq: Four times a day (QID) | ORAL | 0 refills | Status: DC | PRN

## 2017-02-05 MED ORDER — TRAMADOL HCL 50 MG PO TABS
50.0000 mg | ORAL_TABLET | Freq: Four times a day (QID) | ORAL | Status: DC | PRN
  Administered 2017-02-05: 50 mg via ORAL
  Filled 2017-02-05: qty 1

## 2017-02-05 MED ORDER — ACETAMINOPHEN 325 MG PO TABS: 650.0000 mg | ORAL_TABLET | ORAL | Status: DC | PRN

## 2017-02-05 MED ORDER — CIPROFLOXACIN-DEXAMETHASONE 0.3-0.1 % OT SUSP: 4.0000 [drp] | Freq: Two times a day (BID) | OTIC | 0 refills | Status: AC

## 2017-02-05 MED ORDER — CITALOPRAM HYDROBROMIDE 10 MG PO TABS
10.0000 mg | ORAL_TABLET | Freq: Every day | ORAL | Status: DC
  Administered 2017-02-05: 10 mg via ORAL
  Filled 2017-02-05: qty 1
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
## 2017-02-05 NOTE — Progress Notes (Signed)
Orthopedic Tech Progress Note Patient Details:  Kenneth Oneill 1961-02-08 [id number]  Ortho Devices Type of Ortho Device: Postop shoe/boot, Shoulder immobilizer Ortho Device/Splint Interventions: Application   Saul Fordyce 02/05/2017, 1:33 PM
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
## 2017-02-05 NOTE — Progress Notes (Signed)
Patient is discharged from room [phone] at this time. Alert and in stable condition. IV site d/c'd and instructions read to patient with understanding verbalized. Left unit via wheelchair with family and all belongings at side.
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
## 2017-02-13 ENCOUNTER — Telehealth (HOSPITAL_COMMUNITY): Payer: Self-pay

## 2017-02-13 NOTE — Telephone Encounter (Signed)
Returned phone call to patient's wife. States that he has follow-up scheduled with ENT and orthopedics. He does not need trauma clinic appointment. Will not reschedule, but she knows to call back with questions or concerns.

## 2017-02-13 NOTE — Telephone Encounter (Signed)
[phone]   Kenneth Oneill's wife is sick today and unable to bring him to appointment.  Please call to reschedule.

## 2018-06-15 ENCOUNTER — Emergency Department (HOSPITAL_COMMUNITY)
Admission: EM | Admit: 2018-06-15 | Discharge: 2018-06-16 | Disposition: A | Discharge status: Home / Self Care | Payer: Medicaid Other | Attending: Emergency Medicine | Admitting: Emergency Medicine

## 2018-06-15 ENCOUNTER — Other Ambulatory Visit: Payer: Self-pay

## 2018-06-15 ENCOUNTER — Encounter (HOSPITAL_COMMUNITY): Payer: Self-pay | Admitting: Emergency Medicine

## 2018-06-15 DIAGNOSIS — F1414 Cocaine abuse with cocaine-induced mood disorder: Secondary | ICD-10-CM | POA: Diagnosis not present

## 2018-06-15 DIAGNOSIS — F17210 Nicotine dependence, cigarettes, uncomplicated: Secondary | ICD-10-CM | POA: Diagnosis not present

## 2018-06-15 DIAGNOSIS — F319 Bipolar disorder, unspecified: Secondary | ICD-10-CM | POA: Diagnosis not present

## 2018-06-15 DIAGNOSIS — Z79899 Other long term (current) drug therapy: Secondary | ICD-10-CM | POA: Insufficient documentation

## 2018-06-15 DIAGNOSIS — F1010 Alcohol abuse, uncomplicated: Secondary | ICD-10-CM | POA: Diagnosis present

## 2018-06-15 DIAGNOSIS — Z046 Encounter for general psychiatric examination, requested by authority: Secondary | ICD-10-CM | POA: Diagnosis present

## 2018-06-15 DIAGNOSIS — F1014 Alcohol abuse with alcohol-induced mood disorder: Secondary | ICD-10-CM

## 2018-06-15 DIAGNOSIS — F4310 Post-traumatic stress disorder, unspecified: Secondary | ICD-10-CM | POA: Insufficient documentation

## 2018-06-15 LAB — CBC
HCT: 48.9 % (ref 39.0–52.0)
Hemoglobin: 15.2 g/dL (ref 13.0–17.0)
MCH: 30.8 pg (ref 26.0–34.0)
MCHC: 31.1 g/dL (ref 30.0–36.0)
MCV: 99 fL (ref 80.0–100.0)
Platelets: 232 10*3/uL (ref 150–400)
RBC: 4.94 MIL/uL (ref 4.22–5.81)
RDW: 12.5 % (ref 11.5–15.5)
WBC: 9.7 10*3/uL (ref 4.0–10.5)
nRBC: 0 % (ref 0.0–0.2)

## 2018-06-15 LAB — COMPREHENSIVE METABOLIC PANEL
ALT: 31 U/L (ref 0–44)
AST: 28 U/L (ref 15–41)
Albumin: 4.5 g/dL (ref 3.5–5.0)
Alkaline Phosphatase: 58 U/L (ref 38–126)
Anion gap: 13 (ref 5–15)
BUN: 18 mg/dL (ref 6–20)
CO2: 20 mmol/L — ABNORMAL LOW (ref 22–32)
Calcium: 8.9 mg/dL (ref 8.9–10.3)
Chloride: 103 mmol/L (ref 98–111)
Creatinine, Ser: 1.13 mg/dL (ref 0.61–1.24)
GFR calc Af Amer: >60 mL/min (ref >60)
Glucose, Bld: 113 mg/dL — ABNORMAL HIGH (ref 70–99)
Potassium: 4 mmol/L (ref 3.5–5.1)
Sodium: 136 mmol/L (ref 135–145)
TOTAL PROTEIN: 8.2 g/dL — AB (ref 6.5–8.1)
Total Bilirubin: 0.4 mg/dL (ref 0.3–1.2)

## 2018-06-15 LAB — ACETAMINOPHEN LEVEL: Acetaminophen (Tylenol), Serum: <10 ug/mL — ABNORMAL LOW (ref 10–30)

## 2018-06-15 LAB — ETHANOL: Alcohol, Ethyl (B): 179 mg/dL — ABNORMAL HIGH (ref <10)

## 2018-06-15 LAB — SALICYLATE LEVEL: Salicylate Lvl: <7 mg/dL (ref 2.8–30.0)

## 2018-06-15 MED ORDER — LORAZEPAM 2 MG/ML IJ SOLN
2.0000 mg | Freq: Once | INTRAMUSCULAR | Status: AC
  Administered 2018-06-15: 2 mg via INTRAMUSCULAR
  Filled 2018-06-15: qty 1
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
## 2018-06-15 NOTE — ED Notes (Signed)
Patient's cell phone placed in patient's belongings bag and locked in locker #[id number]

## 2018-06-15 NOTE — ED Notes (Signed)
Bed: [facility]41 Expected date:  Expected time:  Means of arrival:  Comments: 
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
## 2018-06-16 DIAGNOSIS — F4310 Post-traumatic stress disorder, unspecified: Secondary | ICD-10-CM | POA: Diagnosis present

## 2018-06-16 DIAGNOSIS — F1014 Alcohol abuse with alcohol-induced mood disorder: Secondary | ICD-10-CM

## 2018-06-16 MED ORDER — QUETIAPINE FUMARATE 200 MG PO TABS: 200.0000 mg | ORAL_TABLET | Freq: Every day | ORAL | 0 refills | Status: AC

## 2018-06-16 MED ORDER — CITALOPRAM HYDROBROMIDE 20 MG PO TABS: 10.0000 mg | ORAL_TABLET | Freq: Every day | ORAL | 0 refills | Status: AC

## 2018-06-16 MED ORDER — CITALOPRAM HYDROBROMIDE 10 MG PO TABS
20.0000 mg | ORAL_TABLET | Freq: Every day | ORAL | Status: DC
  Administered 2018-06-16: 20 mg via ORAL
  Filled 2018-06-16: qty 2

## 2018-06-16 MED ORDER — CITALOPRAM HYDROBROMIDE 10 MG PO TABS: 10.0000 mg | ORAL_TABLET | Freq: Every day | ORAL | Status: DC

## 2018-06-16 MED ORDER — QUETIAPINE FUMARATE 100 MG PO TABS
200.0000 mg | ORAL_TABLET | Freq: Every day | ORAL | Status: DC
  Filled 2018-06-16: qty 2

## 2018-06-16 NOTE — Progress Notes (Signed)
Per Nira Conn, NP pt is recommended for continued observation for safety and stabilization and to be reassessed in the AM by psych. EDP Garlon Hatchet, PA-C and pt's nurse have been advised.  Princess Bruins, MSW, LCSW Therapeutic Triage Specialist  [phone]

## 2018-06-16 NOTE — Progress Notes (Signed)
Received Loura Pardon from [place], upset and demanding to go home. He is having a difficult  to understand he his IVC. He is loud, intrusive and demanding. He was given a snack. Later he received Ativan 2 mg per order which helped to calm him and he drifted off to sleep. He continued to sleep throughout the night.
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
## 2018-06-16 NOTE — ED Notes (Signed)
Pt discharged home. Discharged instructions read to pt who verbalized understanding. All belongings returned to pt who signed for same. Denies SI/HI, is not delusional and not responding to internal stimuli. Escorted pt to the ED exit.   Pt said he did not need prescriptions because they are sent to him by the [place].
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
## 2019-03-13 ENCOUNTER — Other Ambulatory Visit: Payer: Self-pay

## 2019-03-13 ENCOUNTER — Emergency Department (HOSPITAL_COMMUNITY)
Admission: EM | Admit: 2019-03-13 | Discharge: 2019-03-13 | Disposition: A | Discharge status: Home / Self Care | Payer: Medicaid Other | Attending: Emergency Medicine | Admitting: Emergency Medicine

## 2019-03-13 DIAGNOSIS — Z5321 Procedure and treatment not carried out due to patient leaving prior to being seen by health care provider: Secondary | ICD-10-CM | POA: Diagnosis not present

## 2019-03-13 DIAGNOSIS — M545 Low back pain: Secondary | ICD-10-CM | POA: Insufficient documentation

## 2019-03-13 NOTE — ED Triage Notes (Signed)
Pt arrived POV from home CC lower back/left leg pain X2 days. Pt reports unable to ambulate pt presents in lobby in wheelchair.   Hx PTSD

## 2019-03-13 NOTE — ED Notes (Signed)
Upon entering room pt was not present. Pt was seen by registration ambulatory out of ED stating "I am leaving I have been here to long". This RN will document elopement.

## 2020-02-21 ENCOUNTER — Other Ambulatory Visit: Payer: Self-pay

## 2020-02-21 ENCOUNTER — Emergency Department (HOSPITAL_COMMUNITY)
Admission: EM | Admit: 2020-02-21 | Discharge: 2020-02-23 | Disposition: A | Discharge status: Home / Self Care | Payer: No Typology Code available for payment source | Attending: Emergency Medicine | Admitting: Emergency Medicine

## 2020-02-21 ENCOUNTER — Emergency Department (HOSPITAL_COMMUNITY): Payer: No Typology Code available for payment source

## 2020-02-21 ENCOUNTER — Encounter (HOSPITAL_COMMUNITY): Payer: Self-pay | Admitting: Emergency Medicine

## 2020-02-21 ENCOUNTER — Ambulatory Visit: Payer: Self-pay

## 2020-02-21 DIAGNOSIS — F319 Bipolar disorder, unspecified: Secondary | ICD-10-CM | POA: Insufficient documentation

## 2020-02-21 DIAGNOSIS — R451 Restlessness and agitation: Secondary | ICD-10-CM | POA: Diagnosis not present

## 2020-02-21 DIAGNOSIS — Z20822 Contact with and (suspected) exposure to covid-19: Secondary | ICD-10-CM | POA: Insufficient documentation

## 2020-02-21 DIAGNOSIS — F11188 Opioid abuse with other opioid-induced disorder: Secondary | ICD-10-CM | POA: Insufficient documentation

## 2020-02-21 DIAGNOSIS — R310 Gross hematuria: Secondary | ICD-10-CM | POA: Diagnosis not present

## 2020-02-21 DIAGNOSIS — F515 Nightmare disorder: Secondary | ICD-10-CM | POA: Insufficient documentation

## 2020-02-21 DIAGNOSIS — N289 Disorder of kidney and ureter, unspecified: Secondary | ICD-10-CM | POA: Diagnosis not present

## 2020-02-21 DIAGNOSIS — Z046 Encounter for general psychiatric examination, requested by authority: Secondary | ICD-10-CM | POA: Diagnosis not present

## 2020-02-21 DIAGNOSIS — R45851 Suicidal ideations: Secondary | ICD-10-CM | POA: Diagnosis not present

## 2020-02-21 DIAGNOSIS — R079 Chest pain, unspecified: Secondary | ICD-10-CM | POA: Diagnosis present

## 2020-02-21 DIAGNOSIS — F4310 Post-traumatic stress disorder, unspecified: Secondary | ICD-10-CM | POA: Insufficient documentation

## 2020-02-21 LAB — URINALYSIS, ROUTINE W REFLEX MICROSCOPIC
Bilirubin Urine: NEGATIVE
Glucose, UA: NEGATIVE mg/dL
Ketones, ur: 5 mg/dL — AB
Leukocytes,Ua: NEGATIVE
Nitrite: NEGATIVE
Protein, ur: 30 mg/dL — AB
RBC / HPF: >50 RBC/hpf — ABNORMAL HIGH (ref 0–5)
Specific Gravity, Urine: 1.024 (ref 1.005–1.030)
pH: 5 (ref 5.0–8.0)

## 2020-02-21 LAB — COMPREHENSIVE METABOLIC PANEL
ALT: 54 U/L — ABNORMAL HIGH (ref 0–44)
AST: 65 U/L — ABNORMAL HIGH (ref 15–41)
Albumin: 4.2 g/dL (ref 3.5–5.0)
Alkaline Phosphatase: 72 U/L (ref 38–126)
Anion gap: 16 — ABNORMAL HIGH (ref 5–15)
BUN: 12 mg/dL (ref 8–23)
CO2: 18 mmol/L — ABNORMAL LOW (ref 22–32)
Calcium: 9 mg/dL (ref 8.9–10.3)
Chloride: 107 mmol/L (ref 98–111)
Creatinine, Ser: 0.91 mg/dL (ref 0.61–1.24)
GFR, Estimated: >60 mL/min (ref >60)
Glucose, Bld: 108 mg/dL — ABNORMAL HIGH (ref 70–99)
Potassium: 3.9 mmol/L (ref 3.5–5.1)
Sodium: 141 mmol/L (ref 135–145)
Total Bilirubin: 1.4 mg/dL — ABNORMAL HIGH (ref 0.3–1.2)
Total Protein: 7.7 g/dL (ref 6.5–8.1)

## 2020-02-21 LAB — RESPIRATORY PANEL BY RT PCR (FLU A&B, COVID)
Influenza A by PCR: NEGATIVE
Influenza B by PCR: NEGATIVE
SARS Coronavirus 2 by RT PCR: NEGATIVE

## 2020-02-21 LAB — CBC WITH DIFFERENTIAL/PLATELET
Abs Immature Granulocytes: 0.11 10*3/uL — ABNORMAL HIGH (ref 0.00–0.07)
Basophils Absolute: 0.1 10*3/uL (ref 0.0–0.1)
Basophils Relative: 1 %
Eosinophils Absolute: 0.1 10*3/uL (ref 0.0–0.5)
Eosinophils Relative: 1 %
HCT: 46.8 % (ref 39.0–52.0)
Hemoglobin: 15.8 g/dL (ref 13.0–17.0)
Immature Granulocytes: 1 %
Lymphocytes Relative: 29 %
Lymphs Abs: 2.3 10*3/uL (ref 0.7–4.0)
MCH: 33.1 pg (ref 26.0–34.0)
MCHC: 33.8 g/dL (ref 30.0–36.0)
MCV: 97.9 fL (ref 80.0–100.0)
Monocytes Absolute: 0.7 10*3/uL (ref 0.1–1.0)
Monocytes Relative: 10 %
Neutro Abs: 4.5 10*3/uL (ref 1.7–7.7)
Neutrophils Relative %: 58 %
Platelets: 238 10*3/uL (ref 150–400)
RBC: 4.78 MIL/uL (ref 4.22–5.81)
RDW: 13.1 % (ref 11.5–15.5)
WBC: 7.7 10*3/uL (ref 4.0–10.5)
nRBC: 0 % (ref 0.0–0.2)

## 2020-02-21 LAB — RAPID URINE DRUG SCREEN, HOSP PERFORMED
Amphetamines: NOT DETECTED
Barbiturates: NOT DETECTED
Benzodiazepines: NOT DETECTED
Cocaine: POSITIVE — AB
Opiates: POSITIVE — AB
Tetrahydrocannabinol: NOT DETECTED

## 2020-02-21 LAB — SALICYLATE LEVEL: Salicylate Lvl: <7 mg/dL — ABNORMAL LOW (ref 7.0–30.0)

## 2020-02-21 LAB — TROPONIN I (HIGH SENSITIVITY): Troponin I (High Sensitivity): 6 ng/L (ref <18)

## 2020-02-21 LAB — ETHANOL: Alcohol, Ethyl (B): 166 mg/dL — ABNORMAL HIGH (ref <10)

## 2020-02-21 LAB — ACETAMINOPHEN LEVEL: Acetaminophen (Tylenol), Serum: <10 ug/mL — ABNORMAL LOW (ref 10–30)

## 2020-02-21 LAB — LIPASE, BLOOD: Lipase: 33 U/L (ref 11–51)

## 2020-02-21 MED ORDER — LORAZEPAM 2 MG/ML IJ SOLN
INTRAMUSCULAR | Status: AC
  Administered 2020-02-21: 2 mg via INTRAVENOUS
  Filled 2020-02-21: qty 1

## 2020-02-21 MED ORDER — LORAZEPAM 2 MG/ML IJ SOLN: 2.0000 mg | Freq: Once | INTRAMUSCULAR | Status: AC

## 2020-02-21 MED ORDER — CITALOPRAM HYDROBROMIDE 10 MG PO TABS
10.0000 mg | ORAL_TABLET | Freq: Every day | ORAL | Status: DC
  Administered 2020-02-22 – 2020-02-23 (×2): 10 mg via ORAL
  Filled 2020-02-21 (×2): qty 1

## 2020-02-21 MED ORDER — QUETIAPINE FUMARATE 200 MG PO TABS
200.0000 mg | ORAL_TABLET | Freq: Every day | ORAL | Status: DC
  Administered 2020-02-22 (×2): 200 mg via ORAL
  Filled 2020-02-21 (×2): qty 1

## 2020-02-21 NOTE — Telephone Encounter (Signed)
Pt's wife called stating her husband is urinating bright red blood x 1 episode. Wife stated blood is dripping from his penis. Wife stated pt was previously c/o abdominal pain.  Could hear pt in the background screaming at her several times to "hang up the phone" and "Im not going anywhere". Pt's wife stated pt stated he  just wants to die. Asked pt's wife if he is suicidal and she said yes. Pt refused to answer any questions from wife and refused to talk to NT.  Pt has h/o alcohol and drug use and h/o PTSD.  Wife stated she was very scared about the bleeding and didn't know what to do. Pt refused to go to ED or UCC for evaluation. Advised her that if pt expressed suicidal ideation and after hearing him screaming, it was clear he is in crisis and I'm obligated to call for emergency help. Advised pt's wife to remove any guns, knives and anything pt could use to hurt himself or her. Wife in agreement to call 911. Advised wife to remove herself from him and their house if he becomes violent to protect herself. Call 911 and informed them of pt's address, name and DOB as well as h/o  PTSD and drug and alcohol abuse. Expressed concerns to 911 dispatch that pt is screaming and potentially dangerous to himself and others.  Wife informed that emergency services are on the way. Wife verbalized understanding and stated she had to get off the phone.   Reason for Disposition . Passing pure blood or large blood clots (i.e., size > a dime) (Exception: fleck or small strands)  Answer Assessment - Initial Assessment Questions 1. COLOR of URINE: "Describe the color of the urine."  (e.g., tea-colored, pink, red, blood clots, bloody)     Bright red 2. ONSET: "When did the bleeding start?"      Today 3. EPISODES: "How many times has there been blood in the urine?" or "How many times today?"     one 4. PAIN with URINATION: "Is there any pain with passing your urine?" If Yes, ask: "How bad is the pain?"  (Scale 1-10; or  mild, moderate, severe)    - MILD - complains slightly about urination hurting    - MODERATE - interferes with normal activities      - SEVERE - excruciating, unwilling or unable to urinate because of the pain     Refuses to answer 5. FEVER: "Do you have a fever?" If Yes, ask: "What is your temperature, how was it measured, and when did it start?"     no 6. ASSOCIATED SYMPTOMS: "Are you passing urine more frequently than usual?"     yes 7. OTHER SYMPTOMS: "Do you have any other symptoms?" (e.g., back/flank pain, abdominal pain, vomiting)     abdominal pain 8. PREGNANCY: "Is there any chance you are pregnant?" "When was your last menstrual period?"     n/a  Protocols used: URINE - BLOOD IN-A-AH

## 2020-02-21 NOTE — ED Notes (Signed)
Patient transported to CT 

## 2020-02-21 NOTE — ED Notes (Signed)
Pt belongings inventoried and placed in [facility] locker #1.

## 2020-02-21 NOTE — ED Triage Notes (Signed)
Pt BIB [facility], c/o hematuria, chest pain x 2 days, and dark, loose stools that started today. Pt reports feeling suicidal and depressed after he and his significant other separated. States he regularly drinks liquor and used oxy and cocaine today.

## 2020-02-21 NOTE — ED Notes (Signed)
Pt becoming verbally aggressive and combative with this RN. Pt yelling and saying that he will not be held in the hospital, and that his lawyer will be contacting hospital staff for making him stay.

## 2020-02-21 NOTE — BH Assessment (Signed)
[facility] Assessment Progress Note  Patient was administered 2mg  Ativan IM at 20:46.  This clinician checked with nurse Sophia.  She said he was resting w/ his eyes closed at this time.  TTS to assess patient when he is alert and oriented.  Clinician did request IVC papers to be faxed to [phone].

## 2020-02-21 NOTE — Discharge Instructions (Signed)
Please follow up with [facility] regarding the blood in your urine and CT scan findings concerning for a lesion on your right kidney

## 2020-02-21 NOTE — ED Provider Notes (Signed)
[facility] EMERGENCY DEPARTMENT Provider Note   CSN: [id number] Arrival date & time: 02/21/20  1737     History Chief Complaint  Patient presents with  . Chest Pain  . Depression    Kenneth Oneill is a 59 y.o. male with PMHx PTSD, bipolar disorder who presents to the ED via EMS with multiple complaints.   EMS was called out due to hematuria for the past 2 days however when they arrived on the scene pt was found pacing back and forth in his kitchen stating that he wanted to harm himself. [employer] reports pt appeared agitated. He did not want to come to the ED initially - it appears his wife called EMS. Per EMS they are separating which caused patient to become upset. He admitted to suicidal ideation at the scene and with nursing staff when he was first brought in with a plan to "take a bunch of pills." Pt states he recently relapsed and used alcohol and cocaine today. His wife is in the ED as well as reports she believes he took oxycodone as well. Pt denies HI or AVH.   Pt states he has been having gross hematuria for the past 2 days; passing clots as well. He is having some lower abdominal pain however he stats this has been ongoing for "some time" and he is scheduled for a colonoscopy soon.   He is also complaining of left sided chest pain that has been present for the past 2 days however denies any chest pain currently. No nausea, vomiting, diaphoresis, leg swelling, or any other associated symptoms.   The history is provided by the patient, the spouse, the EMS personnel and the police.       Past Medical History:  Diagnosis Date  . Arthritis   . Bipolar disorder (HCC)   . Plantar fasciitis, bilateral   . PTSD (post-traumatic stress disorder)     Patient Active Problem List   Diagnosis Date Noted  . PTSD (post-traumatic stress disorder) 06/16/2018  . Nondisplaced fracture of shaft of right clavicle, initial encounter for closed fracture 02/04/2017  .  Nondisplaced fracture of middle phalanx of left lesser toe(s), initial encounter for open fracture 02/04/2017  . Skull fracture (HCC) 02/03/2017  . Cocaine abuse with cocaine-induced mood disorder (HCC) 12/29/2016  . Alcohol abuse 12/29/2016    Past Surgical History:  Procedure Laterality Date  . NERVE, TENDON AND ARTERY REPAIR Right 03/17/2014   Procedure: EXPLORE AND REPAIR RIGHT HAND;  Surgeon: Dairl Ponder, MD;  Location: [facility] OR;  Service: Orthopedics;  Laterality: Right;       No family history on file.  Social History   Tobacco Use  . Smoking status: Current Every Day Smoker    Packs/day: 0.50    Types: Cigarettes  . Smokeless tobacco: Never Used  Vaping Use  . Vaping Use: Never used  Substance Use Topics  . Alcohol use: Yes    Comment: occasionally  . Drug use: Yes    Types: Cocaine    Comment: Denies but per [employer] cocaine use    Home Medications Prior to Admission medications   Medication Sig Start Date End Date Taking? Authorizing Provider  citalopram (CELEXA) 20 MG tablet Take 0.5 tablets (10 mg total) by mouth daily. 06/17/18  Yes Charm Rings, NP  Multiple Vitamins-Minerals (MULTIVITAMIN ADULTS 50+) TABS Take 1 tablet by mouth daily.   Yes [provider]  QUEtiapine (SEROQUEL) 200 MG tablet Take 1 tablet (200 mg total) by  mouth at bedtime. 06/16/18  Yes Charm RingsLord, Jamison Y, NP  vitamin B-12 (CYANOCOBALAMIN) 100 MCG tablet Take 100 mcg by mouth daily.   Yes [provider]    Allergies    Patient has no known allergies.  Review of Systems   Review of Systems  Constitutional: Negative for chills and fever.  Respiratory: Negative for cough and shortness of breath.   Cardiovascular: Positive for chest pain.  Gastrointestinal: Positive for abdominal pain. Negative for diarrhea, nausea and vomiting.  Genitourinary: Positive for hematuria.  All other systems reviewed and are negative.   Physical Exam Updated Vital Signs BP (!) 136/95 (BP  Location: Left Arm)   Pulse (!) 109   Temp 98.7 F (37.1 C) (Oral)   SpO2 97%   Physical Exam Vitals and nursing note reviewed.  Constitutional:      Appearance: He is not ill-appearing or diaphoretic.     Comments: Agitated  HENT:     Head: Normocephalic and atraumatic.  Eyes:     Conjunctiva/sclera: Conjunctivae normal.  Cardiovascular:     Rate and Rhythm: Normal rate and regular rhythm.     Pulses:          Radial pulses are 2+ on the right side and 2+ on the left side.       Dorsalis pedis pulses are 2+ on the right side and 2+ on the left side.     Heart sounds: Normal heart sounds.  Pulmonary:     Effort: Pulmonary effort is normal.     Breath sounds: Normal breath sounds. No decreased breath sounds, wheezing, rhonchi or rales.  Chest:     Chest wall: No tenderness.  Abdominal:     Palpations: Abdomen is soft.     Tenderness: There is no abdominal tenderness. There is no guarding or rebound.  Musculoskeletal:     Cervical back: Neck supple.  Skin:    General: Skin is warm and dry.  Neurological:     Mental Status: He is alert.  Psychiatric:        Thought Content: Thought content includes suicidal ideation. Thought content includes suicidal plan.     ED Results / Procedures / Treatments   Labs (all labs ordered are listed, but only abnormal results are displayed) Labs Reviewed  COMPREHENSIVE METABOLIC PANEL - Abnormal; Notable for the following components:      Result Value   CO2 18 (*)    Glucose, Bld 108 (*)    AST 65 (*)    ALT 54 (*)    Total Bilirubin 1.4 (*)    Anion gap 16 (*)    All other components within normal limits  ETHANOL - Abnormal; Notable for the following components:   Alcohol, Ethyl (B) 166 (*)    All other components within normal limits  RAPID URINE DRUG SCREEN, HOSP PERFORMED - Abnormal; Notable for the following components:   Opiates POSITIVE (*)    Cocaine POSITIVE (*)    All other components within normal limits  CBC WITH  DIFFERENTIAL/PLATELET - Abnormal; Notable for the following components:   Abs Immature Granulocytes 0.11 (*)    All other components within normal limits  ACETAMINOPHEN LEVEL - Abnormal; Notable for the following components:   Acetaminophen (Tylenol), Serum <10 (*)    All other components within normal limits  SALICYLATE LEVEL - Abnormal; Notable for the following components:   Salicylate Lvl <7.0 (*)    All other components within normal limits  URINALYSIS, ROUTINE W  REFLEX MICROSCOPIC - Abnormal; Notable for the following components:   Color, Urine AMBER (*)    APPearance CLOUDY (*)    Hgb urine dipstick LARGE (*)    Ketones, ur 5 (*)    Protein, ur 30 (*)    RBC / HPF >50 (*)    Bacteria, UA RARE (*)    All other components within normal limits  RESPIRATORY PANEL BY RT PCR (FLU A&B, COVID)  LIPASE, BLOOD  TROPONIN I (HIGH SENSITIVITY)    EKG None  Radiology DG Chest Port 1 View  Result Date: 02/21/2020 CLINICAL DATA:  Hematuria, chest pain EXAM: PORTABLE CHEST 1 VIEW COMPARISON:  02/03/2017 FINDINGS: Lungs are clear. No pneumothorax or pleural effusion. Cardiomediastinal silhouette unremarkable. Pulmonary vascularity is normal. Remote, healed right mid diaphyseal clavicular fracture again noted. No acute bone abnormality. IMPRESSION: No active disease. Electronically Signed   By: Helyn Numbers MD   On: 02/21/2020 18:54   CT Renal Stone Study  Result Date: 02/21/2020 CLINICAL DATA:  Hematuria, melena, chest pain EXAM: CT ABDOMEN AND PELVIS WITHOUT CONTRAST TECHNIQUE: Multidetector CT imaging of the abdomen and pelvis was performed following the standard protocol without IV contrast. COMPARISON:  None. FINDINGS: Lower chest: The visualized lung bases are clear bilaterally. The visualized heart and pericardium are unremarkable. Hepatobiliary: Mild hepatic steatosis. Liver and gallbladder are otherwise unremarkable. No intra or extrahepatic biliary ductal dilation. Pancreas:  Unremarkable Spleen: Unremarkable Adrenals/Urinary Tract: The adrenal glands are unremarkable. The kidneys are normal in size and position. Indeterminate 11 mm exophytic low-attenuation lesion arising from the a lower pole of the right kidney laterally 9 mm simple cortical cyst arises from the upper pole of the left kidney. No intrarenal or ureteral calculi are identified. No hydronephrosis. The bladder is unremarkable. Stomach/Bowel: Severe descending and sigmoid colonic diverticulosis. The stomach, small bowel, and large bowel are otherwise unremarkable. No evidence of obstruction or focal inflammation. Appendix normal. No free intraperitoneal gas or fluid. Vascular/Lymphatic: Moderate aortoiliac atherosclerotic calcification. No aortic aneurysm. No pathologic adenopathy within the abdomen and pelvis. Reproductive: Prostate is unremarkable. Other: Rectum unremarkable. Musculoskeletal: Degenerative changes are seen within the a lumbar spine no lytic or blastic bone lesions are seen. No acute bone abnormality. IMPRESSION: No nephro or urolithiasis. No definite radiographic explanation for the patient's reported symptoms. Indeterminate 11 mm exophytic low-attenuation lesion arising from the lower pole the right kidney may represent a hyperdense renal cyst or cystic renal neoplasm. This would be better assessed with dedicated renal sonography once the patient's acute issues have resolved. Severe distal colonic diverticulosis. Aortic Atherosclerosis (ICD10-I70.0). Electronically Signed   By: Helyn Numbers MD   On: 02/21/2020 20:30    Procedures Procedures (including critical care time)  Medications Ordered in ED Medications  citalopram (CELEXA) tablet 10 mg (has no administration in time range)  QUEtiapine (SEROQUEL) tablet 200 mg (has no administration in time range)  LORazepam (ATIVAN) injection 2 mg (2 mg Intravenous Given 02/21/20 2046)    ED Course  I have reviewed the triage vital signs and the  nursing notes.  Pertinent labs & imaging results that were available during my care of the patient were reviewed by me and considered in my medical decision making (see chart for details).  Clinical Course as of Feb 21 2311  Sat Feb 21, 2020  1906 Alcohol, Ethyl (B)(!): 166 [MV]    Clinical Course User Index [MV] Tanda Rockers, [place]   MDM Rules/Calculators/A&P  59 year old male presenting to the ED today brought in by EMS/[employer] - initially called out for gross hematuria x 2 days however when EMS arrived pt was agitated stating that he was suicidal. He came voluntarily however as he arrived pt continues to state he is not going to stay here and we can't hold him. He endorses suicidal plan to EMS/[employer], nursing staff, and to myself stating he was going to "take a handful of pills." It appears he is going through a separation with his wife which caused him to start using drugs again - endorses EtOH and cocaine use today. On arrival to the ED pt is tachycardic however suspect this is related to his agitation as he continues to state he won't stay here against his will. He was not IVC'd by [employer]. I had discussion with pt regarding his willingness to stay here voluntary vs having to be IVC'd. Pt is under the influence today and intoxicated and does not appear to be making much rational sense. He will need to be IVCd for safety as I suspect the minute he is left alone he will flee. Will work up for hematuria at this time; pt also endorsed chest pain to EMS however states he has none currently.   EKG without acute ischemic changes CBC without leukocytosis. Hgb stable at 15.8.  CMP with mild elevation in AST/ALT/ and T bili however suspect this is slightly elevated in the setting of alcohol intoxication.  ETOH elevated at 166 Lipase normal at 33 Troponin within normal limits at 6 CXR clear U/A with large hgb per dipstick, > 50 RBCs per HPF, and 11-20 WBC with rare bacteria. Will  plan for CT renal stone study at this time.  UDS positive for opiates and cocaine  CT: IMPRESSION:  No nephro or urolithiasis. No definite radiographic explanation for  the patient's reported symptoms.    Indeterminate 11 mm exophytic low-attenuation lesion arising from  the lower pole the right kidney may represent a hyperdense renal  cyst or cystic renal neoplasm. This would be better assessed with  dedicated renal sonography once the patient's acute issues have  resolved.    Severe distal colonic diverticulosis.   Will have patient follow up with urology in the outpatient setting regarding this. He is currently medically cleared and pending TTS eval.  This note was prepared using Dragon voice recognition software and may include unintentional dictation errors due to the inherent limitations of voice recognition software.   Final Clinical Impression(s) / ED Diagnoses Final diagnoses:  Chest pain  Gross hematuria  Renal lesion  Involuntary commitment  Suicidal ideation    Rx / DC Orders ED Discharge Orders    None       Tanda Rockers, PA-C 02/21/20 2312    Margarita Grizzle, MD 02/23/20 1520

## 2020-02-22 DIAGNOSIS — R45851 Suicidal ideations: Secondary | ICD-10-CM | POA: Insufficient documentation

## 2020-02-22 DIAGNOSIS — F1410 Cocaine abuse, uncomplicated: Secondary | ICD-10-CM | POA: Insufficient documentation

## 2020-02-22 NOTE — BH Assessment (Signed)
Comprehensive Clinical Assessment (CCA) Note  02/22/2020 Kenneth Oneill [id number]  Visit Diagnosis:      ICD-10-CM   1. Involuntary commitment  Z04.6   2. Chest pain  R07.9 DG Chest [facility] 1 View    DG Chest [place] 1 View  3. Gross hematuria  R31.0   4. Renal lesion  N28.9   5. Suicidal ideation  R45.851      Kenneth Oneill is a 59 yo male presenting to [facility] under IVC. Pt was very alert and oriented throughout and session. Pt was unaccompanied in room at time of assessment. Pt brought to ED via EMS--called because pt had bleeding from his penis, highly agitated, and stating that he wanted to harm himself with a plan to overdose on medication. Incident triggered by relationship seperation.  At the time EMS was called, pt was very escalated and expressed suicidal ideation at that time. Pt currently denies SI or HI at time of assessment.   Pt reports that he has a past history of PTSD and is currently being treated by the [place] in [place]. Pt repots that he has appointments every other month.  Pt reports that he is currently taking citalopram and seroquel to help manage symptoms.Pt denies any current SI, HI or AVH. Pt admits that he recently relapsed and is using ETOH and powder cocaine.    Pt does report occasional nightmares and flashbacks. Pt states that primary life stressor at this time is living in unsafe location--pt feels that there is too much drug/crime activity going on in his current neighborhood and feels his safety is in [place].  Pt currently states that he is not a danger to himself and that he wants to go home to take care of his pets (1 dog and 4 cats).  Kenneth Oneill, MSW, LCSW Outpatient Therapist/Triage Specialist  Disposition: Per Maxie Barb NP: pt needs to remain inpatient for additional medical observation.   CCA Screening, Triage and Referral (STR)  Patient Reported Information How did you hear about [place]? Legal System  Referral name: EMS/IVC  Referral  phone number: No data recorded  Whom do you see for routine medical problems? Primary Care  Practice/Facility Name: No data recorded Practice/Facility Phone Number: No data recorded Name of Contact: No data recorded Contact Number: No data recorded Contact Fax Number: No data recorded Prescriber Name: No data recorded Prescriber Address (if known): No data recorded  What Is the Reason for Your Visit/Call Today? No data recorded How Long Has This Been Causing You Problems? <Week  What Do You Feel Would Help You the Most Today? Assessment Only;Therapy;Medication   Have You Recently Been in Any Inpatient Treatment (Hospital/Detox/Crisis Center/28-Day Program)? No  Name/Location of Program/Hospital:No data recorded How Long Were You There? No data recorded When Were You Discharged? No data recorded  Have You Ever Received Services From [facility] Before? Yes  Who Do You See at [facility]? No data recorded  Have You Recently Had Any Thoughts About Hurting Yourself? No (pt denies)  Are You Planning to Commit Suicide/Harm Yourself At This time? No (pt denies)   Have you Recently Had Thoughts About Hurting Someone Kenneth Oneill? No  Explanation: No data recorded  Have You Used Any Alcohol or Drugs in the Past 24 Hours? Yes (etoh and cocaine)  How Long Ago Did You Use Drugs or Alcohol? No data recorded What Did You Use and How Much? No data recorded  Do You Currently Have a Therapist/Psychiatrist? Yes  Name of Therapist/Psychiatrist: "I  have a mental health doctor at the [facility] in  [place]--don't remember their name"   Have You Been Recently Discharged From Any Office Practice or Programs? No  Explanation of Discharge From Practice/Program: No data recorded    CCA Screening Triage Referral Assessment Type of Contact: Tele-Assessment  Is this Initial or Reassessment? Initial Assessment  Date Telepsych consult ordered in [facility]:  No data recorded Time Telepsych consult ordered in  [facility]:  No data recorded  Patient Reported Information Reviewed? Yes  Patient Left Without Being Seen? No data recorded Reason for Not Completing Assessment: Pt administered Ativan at 20:46   Collateral Involvement: none at time of assessment   Does Patient Have a Court Appointed Legal Guardian? No data recorded Name and Contact of Legal Guardian: No data recorded If Minor and Not Living with Parent(s), Who has Custody? No data recorded Is CPS involved or ever been involved? Never  Is APS involved or ever been involved? Never   Patient Determined To Be At Risk for Harm To Self or Others Based on Review of Patient Reported Information or Presenting Complaint? No (pt denies SI, HI)  Method: No data recorded Availability of Means: No data recorded Intent: No data recorded Notification Required: No data recorded Additional Information for Danger to Others Potential: No data recorded Additional Comments for Danger to Others Potential: No data recorded Are There Guns or Other Weapons in Your Home? No data recorded Types of Guns/Weapons: No data recorded Are These Weapons Safely Secured?                            No data recorded Who Could Verify You Are Able To Have These Secured: No data recorded Do You Have any Outstanding Charges, Pending Court Dates, Parole/Probation? No data recorded Contacted To Inform of Risk of Harm To Self or Others: No data recorded  [facility]   Does Patient Present under Involuntary Commitment? Yes  IVC Papers Initial File Date: 02/21/20   [place]   Patient Currently Receiving the Following Services: Medication Management (Through [facility])   Determination of Need: No data recorded  Options For Referral: Other: Comment (Per medical provider pt needs continuing medical observation)     CCA Biopsychosocial  Intake/Chief Complaint:  CCA Intake With Chief Complaint CCA Part Two Date: 02/22/20 CCA Part Two  Time: 0840 Chief Complaint/Presenting Problem: Kenneth Oneill is a 59 yo male [facility]. Pt was very alert and oriented throughout and session. Pt was unaccompanied in room at time of assessment. Pt brought to ED via EMS--called because pt had bleeding from his penis, highly agitated, and stating that he wanted to harm himself with a plan to overdose on medication. Incident triggered by relationship seperation.  At the time EMS was called, pt was very escalated and expressed suicidal ideation at that time. Pt currently denies SI or HI at time of assessment. Pt reports that he has a past history of PTSD and is currently being treated by the [facility] [place]. Pt repots that he has appointments every other month.  Pt reports that he is currently taking citalopram and seroquel to help manage symptoms.Pt denies any current SI, HI or AVH. Pt admits that he recently relapsed and is using ETOH and powder cocaine.  Pt does report occasional nightmares and flashbacks. Pt states that primary life stressor at this time is living in unsafe location--pt feels that there is too  much drug/crime activity going on in his current neighborhood and feels his safety is in [place]. Patient's Currently Reported Symptoms/Problems: depression, anxiety, fears about current health situation Individual's Strengths: strong family support  Mental Health Symptoms Depression:  Depression: Fatigue, Irritability, Duration of symptoms greater than two weeks  Mania:     Anxiety:   Anxiety: Worrying  Psychosis:  Psychosis: None (pt reports he used to see shadows when in [employer]. denies current AVH)  Trauma:  Trauma: Re-experience of traumatic event (occasional flashbacks)  Obsessions:     Compulsions:  Compulsions: None  Inattention:  Inattention: None  Hyperactivity/Impulsivity:     Oppositional/Defiant Behaviors:  Oppositional/Defiant Behaviors: None  Emotional Irregularity:  Emotional Irregularity: None  Other  Mood/Personality Symptoms:      Mental Status Exam Appearance and self-care  Stature:  Stature: Average  Weight:  Weight: Average weight  Clothing:     Grooming:  Grooming: Normal  Cosmetic use:  Cosmetic Use: None  Posture/gait:  Posture/Gait: Normal  Motor activity:  Motor Activity: Restless  Sensorium  Attention:  Attention: Normal  Concentration:  Concentration: Normal  Orientation:  Orientation: X5  Recall/memory:  Recall/Memory: Normal  Affect and Mood  Affect:  Affect: Anxious  Mood:  Mood: Anxious  Relating  Eye contact:  Eye Contact: Normal  Facial expression:  Facial Expression: Anxious  Attitude toward examiner:  Attitude Toward Examiner: Cooperative  Thought and Language  Speech flow: Speech Flow: Other (Comment) (dental-related speech difficulties)  Thought content:  Thought Content: Appropriate to Mood and Circumstances  Preoccupation:  Preoccupations: None  Hallucinations:  Hallucinations: None  Organization:     Company secretary of Knowledge:  Fund of Knowledge: Fair  Intelligence:  Intelligence: Average  Abstraction:  Abstraction: Normal  Judgement:  Judgement: Impaired  Reality Testing:  Reality Testing: Variable  Insight:  Insight: Gaps  Decision Making:  Decision Making: Impulsive  Social Functioning  Social Maturity:  Social Maturity: Impulsive  Social Judgement:  Social Judgement: "Chief of Staff", Victimized  Stress  Stressors:  Stressors: Housing  Coping Ability:  Coping Ability: Normal  Skill Deficits:  Skill Deficits: Self-control  Supports:  Supports: Water engineer, Family    Exercise/Diet: Exercise/Diet Do You Exercise?: No Have You Gained or Lost A Significant Amount of Weight in the Past Six Months?: No Do You Follow a Special Diet?: No Do You Have Any Trouble Sleeping?: No   CCA Employment/Education  Employment/Work Situation: Employment / Work Situation Has patient ever been in the [employer]?: Yes (Describe in  comment)   CCA Family/Childhood History  Family and Relationship History: Family history Marital status: Long term relationship Long term relationship, how long?: 10+ years Additional relationship information: fiance--Lucia [phone]  Pt gives verbal consent to contact [place] at time of assessment.  Childhood History:  Childhood History By whom was/is the patient raised?: Both parents Additional childhood history information: pt reports good childhood and stable relationship with both parents Description of patient's relationship with caregiver when they were a child: stable Did patient suffer any verbal/emotional/physical/sexual abuse as a child?: No Did patient suffer from severe childhood neglect?: No Has patient ever been sexually abused/assaulted/raped as an adolescent or adult?: No Was the patient ever a victim of a crime or a disaster?: No Witnessed domestic violence?: No Has patient been affected by domestic violence as an adult?: No  Child/Adolescent Assessment:     CCA Substance Use  Alcohol/Drug Use: Alcohol / Drug Use Pain Medications: oxycontin History of alcohol / drug use?: Yes Substance #  1 Name of Substance 1: etoh 1 - Last Use / Amount: regularly--last used within 24 hrs Substance #2 Name of Substance 2: cocaine-powder 2 - Last Use / Amount: regularly--last used within 24 hrs     ASAM's:  Six Dimensions of Multidimensional Assessment  Dimension 1:  Acute Intoxication and/or Withdrawal Potential:   Dimension 1:  Description of individual's past and current experiences of substance use and withdrawal: past history of drug/alcohol use  Dimension 2:  Biomedical Conditions and Complications:   Dimension 2:  Description of patient's biomedical conditions and  complications: complex medical history  Dimension 3:  Emotional, Behavioral, or Cognitive Conditions and Complications:  Dimension 3:  Description of emotional, behavioral, or cognitive conditions and  complications: complex mental health history  Dimension 4:  Readiness to Change:  Dimension 4:  Description of Readiness to Change criteria: pt has tried to stop before  Dimension 5:  Relapse, Continued use, or Continued Problem Potential:  Dimension 5:  Relapse, continued use, or continued problem potential critiera description: history of relapse  Dimension 6:  Recovery/Living Environment:  Dimension 6:  Recovery/Iiving environment criteria description: environment difficult to make lasting change--per patient  ASAM Severity Score: ASAM's Severity Rating Score: 10  ASAM Recommended Level of Treatment:     Substance use Disorder (SUD) Substance Use Disorder (SUD)  Checklist Symptoms of Substance Use: Continued use despite having a persistent/recurrent physical/psychological problem caused/exacerbated by use, Continued use despite persistent or recurrent social, interpersonal problems, caused or exacerbated by use, Evidence of tolerance, Large amounts of time spent to obtain, use or recover from the substance(s), Substance(s) often taken in larger amounts or over longer times than was intended  Recommendations for Services/Supports/Treatments: Recommendations for Services/Supports/Treatments Recommendations For Services/Supports/Treatments: Other (Comment), Individual Therapy, Medication Management (Medical observation--continue med management and outpatient therapy)  DSM5 Diagnoses: Patient Active Problem List   Diagnosis Date Noted  . PTSD (post-traumatic stress disorder) 06/16/2018  . Nondisplaced fracture of shaft of right clavicle, initial encounter for closed fracture 02/04/2017  . Nondisplaced fracture of middle phalanx of left lesser toe(s), initial encounter for open fracture 02/04/2017  . Skull fracture (HCC) 02/03/2017  . Cocaine abuse with cocaine-induced mood disorder (HCC) 12/29/2016  . Alcohol abuse 12/29/2016    Referrals to Alternative Service(s): Referred to Alternative  Service(s):   Place:   Date:   Time:    Referred to Alternative Service(s):   Place:   Date:   Time:    Referred to Alternative Service(s):   Place:   Date:   Time:    Referred to Alternative Service(s):   Place:   Date:   Time:     [facility] [employer] [facility]

## 2020-02-22 NOTE — ED Notes (Signed)
Pt requesting to use telephone.  Called family member.  Pt calm and cooperative at this time  Kenneth Oneill remains with pt

## 2020-02-22 NOTE — ED Notes (Signed)
Received bedisde report from [employer] Pt resting in room at this time. NAD noted.

## 2020-02-22 NOTE — ED Notes (Signed)
TTS completed. 

## 2020-02-22 NOTE — ED Notes (Signed)
PT showered and sheets changed. Fresh scrubs given

## 2020-02-22 NOTE — ED Notes (Signed)
Pt walked out of his room stating "I get claustrophobic being in that room". It was told to he pt that we understood that, and he could have his door wide open. Pt voiced about leaving. RN Lanora Manis attempted to redirect pt. After several attempts, pt walked back into his room.

## 2020-02-22 NOTE — ED Notes (Signed)
Pt went to talk on the phone stating that they wanted to leave the building. When asked to go back into the room. Pt became verbally aggressive to this Clinical research associate. Pt reminded that he was going to talk to the TTS in order to further his placement. Pt became angry stating "I am a free man. I am not a prisoner. I am a veteran. You cannot treat me this way". Reassured pt that this is not a prison. Pt began yelling further, agitating another patient. This writer pressed the duress call and 3 RN's arrived with an EMT. Pt brought into his room after male RN took him back into his room. Sitter infront of the door.

## 2020-02-23 DIAGNOSIS — R45851 Suicidal ideations: Secondary | ICD-10-CM | POA: Diagnosis not present

## 2020-02-23 DIAGNOSIS — Z046 Encounter for general psychiatric examination, requested by authority: Secondary | ICD-10-CM

## 2020-02-23 NOTE — ED Notes (Signed)
Pt very upset, came out of room, yelling he wants to leave and he can not stand it in his room. There are no windows and he can not take it and he wants to and is going to leave. Pt reoriented to situation of being IVC'd and security called.  Pt was corroborative and went back to room with security presence.

## 2020-02-23 NOTE — ED Notes (Signed)
Pt used desk phone to make phone call

## 2020-02-23 NOTE — Consult Note (Signed)
Telepsych Consultation   Reason for Consult:  IVC'd Referring Physician:  EPD Location of Patient: 050C Location of Provider: [facility]  Patient Identification: Kenneth Oneill MRN:  [id number] Principal Diagnosis: <principal problem not specified> Diagnosis:  Active Problems:   * No active hospital problems. *   Total Time spent with patient: 15 minutes  Subjective:   Kenneth Oneill is a 59 y.o. male was seen and evaluated via teleassessment.  He is awake alert and oriented x3.  He presents pleasant, cooperative.  Denying suicidal or homicidal ideations.  Denies auditory or visual hallucinations.  Patient reports having 3 or 4 shots of tequila on yesterday.  States he normally drinks vodka.  Chart reviewed.  UDS on admission was positive for opiates and cocaine.  BAL 166 states he was bleeding from his penis and he became upset.  Patient is requesting to discharge denies suicide attempt or plan.  Reports he is [facility] [place] with a follow-up appointment on 03/15/2000.  He reports taking medications as directed.  Patient denied any recent inpatient admissions for mental health.  Case staffed with attending psychiatrist Lucianne MussKumar.  Patient to keep all follow-up appointments outpatient.  Support,encouragement and reassurance was provided.  HPI:  Kenneth Oneill is a 59 yo male [facility]. Pt was very alert and oriented throughout and session. Pt was unaccompanied in room at time of assessment. Pt brought to ED via EMS--called because pt had bleeding from his penis, highly agitated, and stating that he wanted to harm himself with a plan to overdose on medication. Incident triggered by relationship seperation.  At the time EMS was called, pt was very escalated and expressed suicidal ideation at that time. Pt currently denies SI or HI at time of assessment. Pt reports that he has a past history of PTSD and is currently being [facility]  [place]. Pt repots that he has appointments every other month.  Pt reports that he is currently taking citalopram and seroquel to help manage symptoms.Pt denies any current SI, HI or AVH. 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 Arthritis   . Bipolar disorder (HCC)   . Plantar fasciitis, bilateral   . PTSD (post-traumatic stress disorder)     Past Surgical History:  Procedure Laterality Date  . NERVE, TENDON AND ARTERY REPAIR Right 03/17/2014   Procedure: EXPLORE AND REPAIR RIGHT HAND;  Surgeon: Dairl PonderMatthew Weingold, MD;  [facility];  Service: Orthopedics;  Laterality: Right;   Family History: No family history on file. Family Psychiatric  History:  Social History:  Social History   Substance and Sexual Activity  Alcohol Use Yes   Comment: occasionally     Social History   Substance and Sexual Activity  Drug Use Yes  . Types: Cocaine   Comment: Denies but per [employer] cocaine use    Social History   Socioeconomic History  . Marital status: Single    Spouse name: Not on file  . Number of children: Not on file  . Years of education: Not on file  . Highest education level: Not on file  Occupational History  . Not on file  Tobacco Use  . Smoking status: Current Every Day  Smoker    Packs/day: 0.50    Types: Cigarettes  . Smokeless tobacco: Never Used  Vaping Use  . Vaping Use: Never used  Substance and Sexual Activity  . Alcohol use: Yes    Comment: occasionally  . Drug use: Yes    Types: Cocaine    Comment: Denies but per [employer] cocaine use  . Sexual activity: Yes  Other Topics Concern  . Not on file  Social History Narrative  . Not on file   Social Determinants of Health   Financial Resource Strain:   . Difficulty of Paying Living Expenses:  Not on file  Food Insecurity:   . Worried About Programme researcher, broadcasting/film/video in the Last Year: Not on file  . Ran Out of Food in the Last Year: Not on file  Transportation Needs:   . Lack of Transportation (Medical): Not on file  . Lack of Transportation (Non-Medical): Not on file  Physical Activity:   . Days of Exercise per Week: Not on file  . Minutes of Exercise per Session: Not on file  Stress:   . Feeling of Stress : Not on file  Social Connections:   . Frequency of Communication with Friends and Family: Not on file  . Frequency of Social Gatherings with Friends and Family: Not on file  . Attends Religious Services: Not on file  . Active Member of Clubs or Organizations: Not on file  . Attends Banker Meetings: Not on file  . Marital Status: Not on file   Additional Social History:    Allergies:  No Known Allergies  Labs:  Results for orders placed or performed during the hospital encounter of 02/21/20 (from the past 48 hour(s))  Respiratory Panel by RT PCR (Flu A&B, Covid) - Nasopharyngeal Swab     Status: None   Collection Time: 02/21/20  6:23 PM   Specimen: Nasopharyngeal Swab  Result Value Ref Range   SARS Coronavirus 2 by RT PCR NEGATIVE NEGATIVE    Comment: (NOTE) SARS-CoV-2 target nucleic acids are NOT DETECTED.  The SARS-CoV-2 RNA is generally detectable in upper respiratoy specimens during the acute phase of infection. The lowest concentration of SARS-CoV-2 viral copies this assay can detect is 131 copies/mL. A negative result does not preclude SARS-Cov-2 infection and should not be used as the sole basis for treatment or other patient management decisions. A negative result may occur with  improper specimen collection/handling, submission of specimen other than nasopharyngeal swab, presence of viral mutation(s) within the areas targeted by this assay, and inadequate number of viral copies (<131 copies/mL). A negative result must be combined with  clinical observations, patient history, and epidemiological information. The expected result is Negative.  Fact Sheet for Patients:  https://www.moore.com/  Fact Sheet for Healthcare Providers:  https://www.young.biz/  This test is no t yet approved or cleared by the [place] FDA and  has been authorized for detection and/or diagnosis of SARS-CoV-2 by FDA under an Emergency Use Authorization (EUA). This EUA will remain  in effect (meaning this test can be used) for the duration of the COVID-19 declaration under Section 564(b)(1) of the Act, 21 U.S.C. section 360bbb-3(b)(1), unless the authorization is terminated or revoked sooner.     Influenza A by PCR NEGATIVE NEGATIVE   Influenza B by PCR NEGATIVE NEGATIVE    Comment: (NOTE) The Xpert Xpress SARS-CoV-2/FLU/RSV assay is intended as an aid in  the diagnosis of influenza from Nasopharyngeal swab specimens and  should not be used  as a sole basis for treatment. Nasal washings and  aspirates are unacceptable for Xpert Xpress SARS-CoV-2/FLU/RSV  testing.  Fact Sheet for Patients: https://www.moore.com/  Fact Sheet for Healthcare Providers: https://www.young.biz/  This test is not yet approved or cleared by the [place] FDA and  has been authorized for detection and/or diagnosis of SARS-CoV-2 by  FDA under an Emergency Use Authorization (EUA). This EUA will remain  in effect (meaning this test can be used) for the duration of the  Covid-19 declaration under Section 564(b)(1) of the Act, 21  U.S.C. section 360bbb-3(b)(1), unless the authorization is  terminated or revoked. Performed at [facility], [address], [place], [place] [address]   Comprehensive metabolic panel     Status: Abnormal   Collection Time: 02/21/20  6:33 PM  Result Value Ref Range   Sodium 141 135 - 145 mmol/L   Potassium 3.9 3.5 - 5.1 mmol/L   Chloride 107 98  - 111 mmol/L   CO2 18 (L) 22 - 32 mmol/L   Glucose, Bld 108 (H) 70 - 99 mg/dL    Comment: Glucose reference range applies only to samples taken after fasting for at least 8 hours.   BUN 12 8 - 23 mg/dL   Creatinine, Ser 6.04 0.61 - 1.24 mg/dL   Calcium 9.0 8.9 - 54.0 mg/dL   Total Protein 7.7 6.5 - 8.1 g/dL   Albumin 4.2 3.5 - 5.0 g/dL   AST 65 (H) 15 - 41 U/L   ALT 54 (H) 0 - 44 U/L   Alkaline Phosphatase 72 38 - 126 U/L   Total Bilirubin 1.4 (H) 0.3 - 1.2 mg/dL   GFR, Estimated >98 >11 mL/min    Comment: (NOTE) Calculated using the CKD-EPI Creatinine Equation (2021)    Anion gap 16 (H) 5 - 15    Comment: Performed at [facility], [address] [place], [place] [address]  Ethanol     Status: Abnormal   Collection Time: 02/21/20  6:33 PM  Result Value Ref Range   Alcohol, Ethyl (B) 166 (H) <10 mg/dL    Comment: (NOTE) Lowest detectable limit for serum alcohol is 10 mg/dL.  For medical purposes only. Performed at [facility], [address], [place], [place] [address]   CBC with Diff     Status: Abnormal   Collection Time: 02/21/20  6:33 PM  Result Value Ref Range   WBC 7.7 4.0 - 10.5 K/uL   RBC 4.78 4.22 - 5.81 MIL/uL   Hemoglobin 15.8 13.0 - 17.0 g/dL   HCT 13.0 39 - 52 %   MCV 97.9 80.0 - 100.0 fL   MCH 33.1 26.0 - 34.0 pg   MCHC 33.8 30.0 - 36.0 g/dL   RDW 86.5 78.4 - 69.6 %   Platelets 238 150 - 400 K/uL   nRBC 0.0 0.0 - 0.2 %   Neutrophils Relative % 58 %   Neutro Abs 4.5 1.7 - 7.7 K/uL   Lymphocytes Relative 29 %   Lymphs Abs 2.3 0.7 - 4.0 K/uL   Monocytes Relative 10 %   Monocytes Absolute 0.7 0.1 - 1.0 K/uL   Eosinophils Relative 1 %   Eosinophils Absolute 0.1 0.0 - 0.5 K/uL   Basophils Relative 1 %   Basophils Absolute 0.1 0.0 - 0.1 K/uL   Immature Granulocytes 1 %   Abs Immature Granulocytes 0.11 (H) 0.00 - 0.07 K/uL    Comment: Performed at [facility],  [address], [place], [place] [address]  Acetaminophen level     Status:  Abnormal   Collection Time: 02/21/20  6:33 PM  Result Value Ref Range   Acetaminophen (Tylenol), Serum <10 (L) 10 - 30 ug/mL    Comment: (NOTE) Therapeutic concentrations vary significantly. A range of 10-30 ug/mL  may be an effective concentration for many patients. However, some  are best treated at concentrations outside of this range. Acetaminophen concentrations >150 ug/mL at 4 hours after ingestion  and >50 ug/mL at 12 hours after ingestion are often associated with  toxic reactions.  Performed at [facility], [address] [place], [place] [address]   Salicylate level     Status: Abnormal   Collection Time: 02/21/20  6:33 PM  Result Value Ref Range   Salicylate Lvl <7.0 (L) 7.0 - 30.0 mg/dL    Comment: Performed at [facility], [address], [place], [place] [address]  Lipase, blood     Status: None   Collection Time: 02/21/20  6:33 PM  Result Value Ref Range   Lipase 33 11 - 51 U/L    Comment: Performed at [facility], [address], [place], [place] [address]  Troponin I (High Sensitivity)     Status: None   Collection Time: 02/21/20  6:33 PM  Result Value Ref Range   Troponin I (High Sensitivity) 6 <18 ng/L    Comment: (NOTE) Elevated high sensitivity troponin I (hsTnI) values and significant  changes across serial measurements may suggest ACS but many other  chronic and acute conditions are known to elevate hsTnI results.  Refer to the "Links" section for chest pain algorithms and additional  guidance. Performed at [facility], [address], [place], [place] [address]   Urine rapid drug screen (hosp performed)     Status: Abnormal   Collection Time: 02/21/20  6:34 PM  Result Value Ref Range   Opiates POSITIVE (A) NONE DETECTED   Cocaine POSITIVE (A) NONE DETECTED   Benzodiazepines NONE DETECTED NONE DETECTED   Amphetamines NONE DETECTED NONE DETECTED   Tetrahydrocannabinol NONE DETECTED NONE DETECTED   Barbiturates NONE  DETECTED NONE DETECTED    Comment: (NOTE) DRUG SCREEN FOR MEDICAL PURPOSES ONLY.  IF CONFIRMATION IS NEEDED FOR ANY PURPOSE, NOTIFY LAB WITHIN 5 DAYS.  LOWEST DETECTABLE LIMITS FOR URINE DRUG SCREEN Drug Class                     Cutoff (ng/mL) Amphetamine and metabolites    1000 Barbiturate and metabolites    200 Benzodiazepine                 200 Tricyclics and metabolites     300 Opiates and metabolites        300 Cocaine and metabolites        300 THC                            50 Performed at [facility], [address] [place], [place] [address]   Urinalysis, Routine w reflex microscopic Urine, Clean Catch     Status: Abnormal   Collection Time: 02/21/20  6:34 PM  Result Value Ref Range   Color, Urine AMBER (A) YELLOW    Comment: BIOCHEMICALS MAY BE AFFECTED BY COLOR   APPearance CLOUDY (A) CLEAR   Specific Gravity, Urine 1.024 1.005 - 1.030  pH 5.0 5.0 - 8.0   Glucose, UA NEGATIVE NEGATIVE mg/dL   Hgb urine dipstick LARGE (A) NEGATIVE   Bilirubin Urine NEGATIVE NEGATIVE   Ketones, ur 5 (A) NEGATIVE mg/dL   Protein, ur 30 (A) NEGATIVE mg/dL   Nitrite NEGATIVE NEGATIVE   Leukocytes,Ua NEGATIVE NEGATIVE   RBC / HPF >50 (H) 0 - 5 RBC/hpf   WBC, UA 11-20 0 - 5 WBC/hpf   Bacteria, UA RARE (A) NONE SEEN   Mucus PRESENT    Hyaline Casts, UA PRESENT     Comment: Performed at [facility], [address], [place], [place] [address]    Medications:  Current Facility-Administered Medications  Medication Dose Route Frequency Provider Last Rate Last Admin  . citalopram (CELEXA) tablet 10 mg  10 mg Oral Daily Venter, Margaux, PA-C   10 mg at 02/23/20 1037  . QUEtiapine (SEROQUEL) tablet 200 mg  200 mg Oral QHS Venter, Margaux, PA-C   200 mg at 02/22/20 2205   Current Outpatient Medications  Medication Sig Dispense Refill  . citalopram (CELEXA) 20 MG tablet Take 0.5 tablets (10 mg total) by mouth daily. 30 tablet 0  . Multiple Vitamins-Minerals (MULTIVITAMIN  ADULTS 50+) TABS Take 1 tablet by mouth daily.    . QUEtiapine (SEROQUEL) 200 MG tablet Take 1 tablet (200 mg total) by mouth at bedtime. 30 tablet 0  . vitamin B-12 (CYANOCOBALAMIN) 100 MCG tablet Take 100 mcg by mouth daily.      Musculoskeletal:   Psychiatric Specialty Exam: Physical Exam  Review of Systems  Blood pressure (!) 121/92, pulse 81, temperature 98.4 F (36.9 C), temperature source Oral, resp. rate 18, SpO2 99 %.There is no height or weight on file to calculate BMI.  General Appearance: Casual  Eye Contact:  Good  Speech:  Clear and Coherent  Volume:  Normal  Mood:  Anxious  Affect:  Congruent  Thought Process:  Coherent  Orientation:  Full (Time, Place, and Person)  Thought Content:  WDL  Suicidal Thoughts:  No  Homicidal Thoughts:  No  Memory:  Immediate;   Fair Recent;   Fair  Judgement:  Fair  Insight:  Fair  Psychomotor Activity:  Normal  Concentration:  Concentration: Fair  Recall:  [employer] of Knowledge:  Fair  Language:  Fair  Akathisia:  No  Handed:  Right  AIMS (if indicated):     Assets:  Communication Skills Desire for Improvement Social Support Transportation  ADL's:  Intact  Cognition:  WNL  Sleep:      NP spoke to EPD MD Bero.  Regarding discharge disposition recommendation CSW to provide patient with additional outpatient resources  Disposition: No evidence of imminent risk to self or others at present.   Patient does not meet criteria for psychiatric inpatient admission. Supportive therapy provided about ongoing stressors. Refer to IOP. Discussed crisis plan, support from social network, calling 911, coming to the Emergency Department, and calling Suicide Hotline.  This service was provided via telemedicine using a 2-way, interactive audio and video technology.  Names of all persons participating in this telemedicine service and their role in this encounter. Name: Kamil Mchaffie Role: patient   Name: T.Tzippy Testerman Role: NP            Oneta Rack, NP 02/23/2020 11:06 AM

## 2020-02-23 NOTE — ED Notes (Signed)
Patient was given a Cup of Ginger Ale during Snack.

## 2020-02-23 NOTE — ED Provider Notes (Signed)
I was asked to discharge the patient by psychiatry.  I personally reviewed his lab work and work-up.  Patient does have a mass on his kidney, given urology follow-up.  He voices understanding and is agreeable.  Does not qualify for inpatient psych treatment.  TTS.  Stable for discharge at this time.   Mare Ferrari, PA-C 02/23/20 1122    Sabas Sous, MD 02/23/20 [phone]

## 2020-02-23 NOTE — ED Notes (Signed)
Regular Diet was ordered  For Lunch.

## 2020-02-23 NOTE — ED Notes (Signed)
Pt ambulated to restroom with steady gait.

## 2020-02-23 NOTE — ED Notes (Signed)
Lunch Tray Ordered @ 1017. 

## 2021-02-23 ENCOUNTER — Encounter (HOSPITAL_COMMUNITY): Payer: Self-pay | Admitting: Emergency Medicine

## 2021-02-23 ENCOUNTER — Emergency Department (HOSPITAL_COMMUNITY)
Admission: EM | Admit: 2021-02-23 | Discharge: 2021-02-23 | Disposition: A | Discharge status: Home / Self Care | Payer: No Typology Code available for payment source | Attending: Emergency Medicine | Admitting: Emergency Medicine

## 2021-02-23 ENCOUNTER — Other Ambulatory Visit: Payer: Self-pay

## 2021-02-23 DIAGNOSIS — F10120 Alcohol abuse with intoxication, uncomplicated: Secondary | ICD-10-CM | POA: Diagnosis present

## 2021-02-23 DIAGNOSIS — Z5321 Procedure and treatment not carried out due to patient leaving prior to being seen by health care provider: Secondary | ICD-10-CM | POA: Diagnosis not present

## 2021-02-23 NOTE — ED Triage Notes (Signed)
Pt BIBA.  Per EMS pt c/o alcohol intoxication and SI. 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 Pt's girlfriend reports pt drinks about 1/5th of alcohol per day. Per EMS pt was recently released from the [facility] psych ward for alcohol abuse. Pt currently denies SI, however reported SI to his girlfriend and EMS staff.

## 2021-02-23 NOTE — ED Notes (Signed)
Pt left before the rest of the triage could be completed.

## 2021-02-23 NOTE — ED Provider Notes (Signed)
Reported to patient's room for initial evaluation and informed by RN that patient eloped from ED. Per RN, patient denied SI during triage.    Mannie Stabile, PA-C 02/23/21 1726    Vanetta Mulders, MD 03/11/21 [phone]

## 2021-02-28 ENCOUNTER — Encounter (HOSPITAL_COMMUNITY): Payer: Self-pay | Admitting: Emergency Medicine

## 2021-02-28 ENCOUNTER — Emergency Department (HOSPITAL_COMMUNITY)
Admission: EM | Admit: 2021-02-28 | Discharge: 2021-03-02 | Disposition: A | Discharge status: Home / Self Care | Payer: No Typology Code available for payment source | Attending: Emergency Medicine | Admitting: Emergency Medicine

## 2021-02-28 ENCOUNTER — Other Ambulatory Visit: Payer: Self-pay

## 2021-02-28 DIAGNOSIS — R45851 Suicidal ideations: Secondary | ICD-10-CM | POA: Diagnosis not present

## 2021-02-28 DIAGNOSIS — R7989 Other specified abnormal findings of blood chemistry: Secondary | ICD-10-CM

## 2021-02-28 DIAGNOSIS — K7010 Alcoholic hepatitis without ascites: Secondary | ICD-10-CM

## 2021-02-28 DIAGNOSIS — Z79899 Other long term (current) drug therapy: Secondary | ICD-10-CM | POA: Insufficient documentation

## 2021-02-28 DIAGNOSIS — Y908 Blood alcohol level of 240 mg/100 ml or more: Secondary | ICD-10-CM | POA: Diagnosis not present

## 2021-02-28 DIAGNOSIS — F3170 Bipolar disorder, currently in remission, most recent episode unspecified: Secondary | ICD-10-CM

## 2021-02-28 DIAGNOSIS — R768 Other specified abnormal immunological findings in serum: Secondary | ICD-10-CM

## 2021-02-28 DIAGNOSIS — F17210 Nicotine dependence, cigarettes, uncomplicated: Secondary | ICD-10-CM | POA: Insufficient documentation

## 2021-02-28 DIAGNOSIS — F1914 Other psychoactive substance abuse with psychoactive substance-induced mood disorder: Secondary | ICD-10-CM | POA: Insufficient documentation

## 2021-02-28 DIAGNOSIS — Z20822 Contact with and (suspected) exposure to covid-19: Secondary | ICD-10-CM | POA: Diagnosis not present

## 2021-02-28 DIAGNOSIS — F1020 Alcohol dependence, uncomplicated: Secondary | ICD-10-CM

## 2021-02-28 DIAGNOSIS — F1094 Alcohol use, unspecified with alcohol-induced mood disorder: Secondary | ICD-10-CM

## 2021-02-28 DIAGNOSIS — F4310 Post-traumatic stress disorder, unspecified: Secondary | ICD-10-CM | POA: Diagnosis present

## 2021-02-28 DIAGNOSIS — F1994 Other psychoactive substance use, unspecified with psychoactive substance-induced mood disorder: Secondary | ICD-10-CM

## 2021-02-28 DIAGNOSIS — F10929 Alcohol use, unspecified with intoxication, unspecified: Secondary | ICD-10-CM

## 2021-02-28 LAB — CBC
HCT: 48.3 % (ref 39.0–52.0)
Hemoglobin: 17 g/dL (ref 13.0–17.0)
MCH: 33.5 pg (ref 26.0–34.0)
MCHC: 35.2 g/dL (ref 30.0–36.0)
MCV: 95.3 fL (ref 80.0–100.0)
Platelets: 168 10*3/uL (ref 150–400)
RBC: 5.07 MIL/uL (ref 4.22–5.81)
RDW: 12.3 % (ref 11.5–15.5)
WBC: 5.5 10*3/uL (ref 4.0–10.5)
nRBC: 0 % (ref 0.0–0.2)

## 2021-02-28 LAB — COMPREHENSIVE METABOLIC PANEL
ALT: 323 U/L — ABNORMAL HIGH (ref 0–44)
AST: 1785 U/L — ABNORMAL HIGH (ref 15–41)
Albumin: 3.6 g/dL (ref 3.5–5.0)
Alkaline Phosphatase: 107 U/L (ref 38–126)
Anion gap: 16 — ABNORMAL HIGH (ref 5–15)
BUN: 16 mg/dL (ref 8–23)
CO2: 19 mmol/L — ABNORMAL LOW (ref 22–32)
Calcium: 8.5 mg/dL — ABNORMAL LOW (ref 8.9–10.3)
Chloride: 100 mmol/L (ref 98–111)
Creatinine, Ser: 1.1 mg/dL (ref 0.61–1.24)
GFR, Estimated: >60 mL/min (ref >60)
Glucose, Bld: 122 mg/dL — ABNORMAL HIGH (ref 70–99)
Potassium: 4 mmol/L (ref 3.5–5.1)
Sodium: 135 mmol/L (ref 135–145)
Total Bilirubin: 2.7 mg/dL — ABNORMAL HIGH (ref 0.3–1.2)
Total Protein: 6.7 g/dL (ref 6.5–8.1)

## 2021-02-28 LAB — RAPID URINE DRUG SCREEN, HOSP PERFORMED
Amphetamines: NOT DETECTED
Barbiturates: NOT DETECTED
Benzodiazepines: NOT DETECTED
Cocaine: NOT DETECTED
Opiates: NOT DETECTED
Tetrahydrocannabinol: NOT DETECTED

## 2021-02-28 LAB — ACETAMINOPHEN LEVEL: Acetaminophen (Tylenol), Serum: <10 ug/mL — ABNORMAL LOW (ref 10–30)

## 2021-02-28 LAB — SALICYLATE LEVEL: Salicylate Lvl: <7 mg/dL — ABNORMAL LOW (ref 7.0–30.0)

## 2021-02-28 LAB — ETHANOL: Alcohol, Ethyl (B): 270 mg/dL — ABNORMAL HIGH (ref <10)

## 2021-02-28 MED ORDER — LORAZEPAM 2 MG/ML IJ SOLN
0.0000 mg | Freq: Two times a day (BID) | INTRAMUSCULAR | Status: DC
Start: 2021-03-03 — End: 2021-03-02

## 2021-02-28 MED ORDER — LORAZEPAM 1 MG PO TABS
0.0000 mg | ORAL_TABLET | Freq: Four times a day (QID) | ORAL | Status: DC
Start: 2021-02-28 — End: 2021-03-02

## 2021-02-28 MED ORDER — LORAZEPAM 1 MG PO TABS: 0.0000 mg | ORAL_TABLET | Freq: Two times a day (BID) | ORAL | Status: DC

## 2021-02-28 MED ORDER — ZIPRASIDONE MESYLATE 20 MG IM SOLR
20.0000 mg | Freq: Once | INTRAMUSCULAR | Status: AC
  Administered 2021-02-28: 20 mg via INTRAMUSCULAR
  Filled 2021-02-28: qty 20

## 2021-02-28 MED ORDER — THIAMINE HCL 100 MG/ML IJ SOLN: 100.0000 mg | Freq: Every day | INTRAMUSCULAR | Status: DC

## 2021-02-28 MED ORDER — LORAZEPAM 2 MG/ML IJ SOLN: 0.0000 mg | Freq: Four times a day (QID) | INTRAMUSCULAR | Status: DC

## 2021-02-28 MED ORDER — STERILE WATER FOR INJECTION IJ SOLN
INTRAMUSCULAR | Status: AC
  Administered 2021-02-28: 1 mL
  Filled 2021-02-28: qty 10

## 2021-02-28 MED ORDER — THIAMINE HCL 100 MG PO TABS
100.0000 mg | ORAL_TABLET | Freq: Every day | ORAL | Status: DC
  Administered 2021-03-01: 100 mg via ORAL
  Filled 2021-02-28: qty 1

## 2021-02-28 NOTE — ED Provider Notes (Signed)
TypeEmergency Medicine Provider Triage Evaluation Note  Dvonte Gatliff , a 60 y.o. male  was evaluated in triage.  Pt presents the emergency department today for further evaluation of suicidal ideations.  Patient has a history of for disorder and PTSD.  Police states he has not slept in 2 days and has been stating he wants to kill himself.  He has been drinking heavily.  He has no somatic complaints at this time.  Review of Systems  Positive:  Negative: See above  Physical Exam  BP (!) 128/100   Pulse (!) 102   Temp 98.7 F (37.1 C) (Oral)   Resp 16   SpO2 93%  Gen:   Awake, no distress   Resp:  Normal effort  MSK:   Moves extremities without difficulty  Other:    Medical Decision Making  Medically screening exam initiated at 8:58 PM.  Appropriate orders placed.  Prince Couey was informed that the remainder of the evaluation will be completed by another provider, this initial triage assessment does not replace that evaluation, and the importance of remaining in the ED until their evaluation is complete.  IVC   Teressa Lower, PA-C 02/28/21 2059    Terald Sleeper, MD 02/28/21 2145

## 2021-02-28 NOTE — ED Notes (Signed)
Pt wanded by security. 

## 2021-02-28 NOTE — ED Notes (Signed)
Pt refused vitals and to change in burgundy scrubs

## 2021-02-28 NOTE — ED Notes (Signed)
Belongings placed in locker number 2, one bag.

## 2021-02-28 NOTE — ED Provider Notes (Addendum)
[facility] EMERGENCY DEPARTMENT Provider Note   CSN: [id number] Arrival date & time: 02/28/21  1935     History Chief Complaint  Patient presents with   IVC / Suicidal    Kenneth Oneill is a 60 y.o. male.  Patient presents via [employer] with report of IVC for suicidal thoughts, and possible hallucinations. Pt with hx bipolar disorder, and also with hx etoh use disorder. Pt indicates had drank heavily today, but cannot quantify amount. Patient current denies any thoughts of harm to self or others. Denies hearing voices or seeing things. Pt denies trauma, injury or fall. Pt denies any current pain or other c/o - indicates just wants something to drink and blankets. Denies abd pain or nv. No fever or chills. Pt is a limited historian, etoh intoxication, level 5 caveat.   The history is provided by the patient, the police and medical records. The history is limited by the condition of the patient.      Past Medical History:  Diagnosis Date   Arthritis    Bipolar disorder ([place])    Plantar fasciitis, bilateral    PTSD (post-traumatic stress disorder)     Patient Active Problem List   Diagnosis Date Noted   Suicidal ideation    Cocaine abuse ([place])    PTSD (post-traumatic stress disorder) 06/16/2018   Nondisplaced fracture of shaft of right clavicle, initial encounter for closed fracture 02/04/2017   Nondisplaced fracture of middle phalanx of left lesser toe(s), initial encounter for open fracture 02/04/2017   Skull fracture ([place]) 02/03/2017   Cocaine abuse with cocaine-induced mood disorder ([place]) 12/29/2016   Alcohol abuse 12/29/2016    Past Surgical History:  Procedure Laterality Date   NERVE, TENDON AND ARTERY REPAIR Right 03/17/2014   Procedure: EXPLORE AND REPAIR RIGHT HAND;  Surgeon: Charlotte Crumb, MD;  Location: [place];  Service: Orthopedics;  Laterality: Right;       No family history on file.  Social History   Tobacco Use   Smoking status: Every  Day    Packs/day: 0.50    Types: Cigarettes   Smokeless tobacco: Never  Vaping Use   Vaping Use: Never used  Substance Use Topics   Alcohol use: Yes    Comment: occasionally   Drug use: Yes    Types: Cocaine    Comment: Denies but per [employer] cocaine use    Home Medications Prior to Admission medications   Medication Sig Start Date End Date Taking? Authorizing Provider  citalopram (CELEXA) 20 MG tablet Take 0.5 tablets (10 mg total) by mouth daily. 06/17/18   Patrecia Pour, NP  Multiple Vitamins-Minerals (MULTIVITAMIN ADULTS 50+) TABS Take 1 tablet by mouth daily.    [provider]  QUEtiapine (SEROQUEL) 200 MG tablet Take 1 tablet (200 mg total) by mouth at bedtime. 06/16/18   Patrecia Pour, NP  vitamin B-12 (CYANOCOBALAMIN) 100 MCG tablet Take 100 mcg by mouth daily.    [provider]    Allergies    Patient has no known allergies.  Review of Systems   Review of Systems  Constitutional:  Negative for chills, diaphoresis and fever.  HENT:  Negative for sore throat.   Eyes:  Negative for visual disturbance.  Respiratory:  Negative for shortness of breath.   Cardiovascular:  Negative for chest pain.  Gastrointestinal:  Negative for abdominal pain, diarrhea and vomiting.  Genitourinary:  Negative for flank pain.  Musculoskeletal:  Negative for back pain and neck pain.  Skin:  Negative for rash.  Neurological:  Negative for headaches.  Hematological:  Does not bruise/bleed easily.  Psychiatric/Behavioral:  Negative for agitation and suicidal ideas.    Physical Exam Updated Vital Signs BP (!) 128/100   Pulse (!) 102   Temp 98.7 F (37.1 C) (Oral)   Resp 16   SpO2 93%   Physical Exam Vitals and nursing note reviewed.  Constitutional:      Appearance: Normal appearance. He is well-developed.  HENT:     Head: Atraumatic.     Nose: Nose normal.     Mouth/Throat:     Mouth: Mucous membranes are moist.     Pharynx: Oropharynx is clear.  Eyes:      General: No scleral icterus.    Conjunctiva/sclera: Conjunctivae normal.     Pupils: Pupils are equal, round, and reactive to light.  Neck:     Trachea: No tracheal deviation.     Comments: Spine non-tender. No stiffness or rigidity.  Cardiovascular:     Rate and Rhythm: Normal rate and regular rhythm.     Pulses: Normal pulses.     Heart sounds: Normal heart sounds. No murmur heard.   No friction rub. No gallop.  Pulmonary:     Effort: Pulmonary effort is normal. No accessory muscle usage or respiratory distress.     Breath sounds: Normal breath sounds.  Abdominal:     General: Bowel sounds are normal. There is no distension.     Palpations: Abdomen is soft. There is no mass.     Tenderness: There is no abdominal tenderness. There is no guarding.  Genitourinary:    Comments: No cva tenderness. Musculoskeletal:        General: No tenderness.     Cervical back: Normal range of motion and neck supple. No rigidity.     Comments: CTLS spine, non tender, aligned, no step off. Good rom bilateral extremities without pain or focal bony tenderness.   Skin:    General: Skin is warm and dry.     Findings: No rash.  Neurological:     Mental Status: He is alert.     Comments: Alert, speech clear. Motor/sens grossly intact bil.   Psychiatric:        Mood and Affect: Mood normal.    ED Results / Procedures / Treatments   Labs (all labs ordered are listed, but only abnormal results are displayed) Results for orders placed or performed during the hospital encounter of 02/28/21  Comprehensive metabolic panel  Result Value Ref Range   Sodium 135 135 - 145 mmol/L   Potassium 4.0 3.5 - 5.1 mmol/L   Chloride 100 98 - 111 mmol/L   CO2 19 (L) 22 - 32 mmol/L   Glucose, Bld 122 (H) 70 - 99 mg/dL   BUN 16 8 - 23 mg/dL   Creatinine, Ser 8.18 0.61 - 1.24 mg/dL   Calcium 8.5 (L) 8.9 - 10.3 mg/dL   Total Protein 6.7 6.5 - 8.1 g/dL   Albumin 3.6 3.5 - 5.0 g/dL   AST 2,993 (H) 15 - 41 U/L   ALT  323 (H) 0 - 44 U/L   Alkaline Phosphatase 107 38 - 126 U/L   Total Bilirubin 2.7 (H) 0.3 - 1.2 mg/dL   GFR, Estimated >71 >69 mL/min   Anion gap 16 (H) 5 - 15  Ethanol  Result Value Ref Range   Alcohol, Ethyl (B) 270 (H) <10 mg/dL  Salicylate level  Result Value Ref Range  Salicylate Lvl [id number] (L) 7.0 - 30.0 mg/dL  Acetaminophen level  Result Value Ref Range   Acetaminophen (Tylenol), Serum <10 (L) 10 - 30 ug/mL  cbc  Result Value Ref Range   WBC 5.5 4.0 - 10.5 K/uL   RBC 5.07 4.22 - 5.81 MIL/uL   Hemoglobin 17.0 13.0 - 17.0 g/dL   HCT 48.3 39.0 - 52.0 %   MCV 95.3 80.0 - 100.0 fL   MCH 33.5 26.0 - 34.0 pg   MCHC 35.2 30.0 - 36.0 g/dL   RDW 12.3 11.5 - 15.5 %   Platelets 168 150 - 400 K/uL   nRBC 0.0 0.0 - 0.2 %  Rapid urine drug screen (hospital performed)  Result Value Ref Range   Opiates NONE DETECTED NONE DETECTED   Cocaine NONE DETECTED NONE DETECTED   Benzodiazepines NONE DETECTED NONE DETECTED   Amphetamines NONE DETECTED NONE DETECTED   Tetrahydrocannabinol NONE DETECTED NONE DETECTED   Barbiturates NONE DETECTED NONE DETECTED    EKG None  Radiology No results found.  Procedures Procedures   Medications Ordered in ED Medications  ziprasidone (GEODON) injection 20 mg (20 mg Intramuscular Given 02/28/21 2125)  sterile water (preservative free) injection (1 mL  Given 02/28/21 2126)    ED Course  I have reviewed the triage vital signs and the nursing notes.  Pertinent labs & imaging results that were available during my care of the patient were reviewed by me and considered in my medical decision making (see chart for details).    MDM Rules/Calculators/A&P                          Labs sent.   Reviewed nursing notes and prior charts for additional history.   [place] team consulted - will need to assess when clinically more sober.   IVC and first exam with chart.   Labs reviewed/interpreted by me - AST and ALT very high, with AST:ALT ratio very high -  although ratio c/w etoh hepatitis, given degree of increase in lfts will check acute hepatitis panel.  Pt denies abd pain or nv. No abd tenderness on exam. No fever.   Patient will need reassessment when more sober - BH eval remains pending.   Patients Med Rec remains pending - when complete/verified, will need to restart home meds.   2315, hepatitis panel and BH eval pending - signed out to Dr Leonette Monarch.      Final Clinical Impression(s) / ED Diagnoses Final diagnoses:  None    Rx / DC Orders ED Discharge Orders     None         Lajean Saver, MD 02/28/21 2325

## 2021-02-28 NOTE — ED Notes (Signed)
IVC paperwork sent to [facility]

## 2021-02-28 NOTE — ED Notes (Signed)
IVC paperwork with nurse Steward Drone

## 2021-02-28 NOTE — ED Triage Notes (Signed)
Patient arrived with 2 [employer] officers IVC , suicidal with auditory and visual hallucinations , + ETOH , patient did not disclose hi plan of suicide  . He refused to changed to paper scrubs at triage .

## 2021-02-28 NOTE — ED Notes (Signed)
Franchot Mimes ([place]) of [facility] called and ask to be called with updates when there are every. [phone]

## 2021-03-01 LAB — RESP PANEL BY RT-PCR (FLU A&B, COVID) ARPGX2
Influenza A by PCR: NEGATIVE
Influenza B by PCR: NEGATIVE
SARS Coronavirus 2 by RT PCR: NEGATIVE

## 2021-03-01 LAB — ACETAMINOPHEN LEVEL: Acetaminophen (Tylenol), Serum: <10 ug/mL — ABNORMAL LOW (ref 10–30)

## 2021-03-01 LAB — HEPATITIS PANEL, ACUTE
HCV Ab: REACTIVE — AB
Hep A IgM: NONREACTIVE
Hep B C IgM: NONREACTIVE
Hepatitis B Surface Ag: NONREACTIVE

## 2021-03-01 LAB — COMPREHENSIVE METABOLIC PANEL
ALT: 279 U/L — ABNORMAL HIGH (ref 0–44)
AST: 1290 U/L — ABNORMAL HIGH (ref 15–41)
Albumin: 2.9 g/dL — ABNORMAL LOW (ref 3.5–5.0)
Alkaline Phosphatase: 91 U/L (ref 38–126)
Anion gap: 13 (ref 5–15)
BUN: 14 mg/dL (ref 8–23)
CO2: 21 mmol/L — ABNORMAL LOW (ref 22–32)
Calcium: 8.2 mg/dL — ABNORMAL LOW (ref 8.9–10.3)
Chloride: 101 mmol/L (ref 98–111)
Creatinine, Ser: 0.87 mg/dL (ref 0.61–1.24)
GFR, Estimated: >60 mL/min (ref >60)
Glucose, Bld: 107 mg/dL — ABNORMAL HIGH (ref 70–99)
Potassium: 3.7 mmol/L (ref 3.5–5.1)
Sodium: 135 mmol/L (ref 135–145)
Total Bilirubin: 3.1 mg/dL — ABNORMAL HIGH (ref 0.3–1.2)
Total Protein: 5.6 g/dL — ABNORMAL LOW (ref 6.5–8.1)

## 2021-03-01 MED ORDER — CITALOPRAM HYDROBROMIDE 10 MG PO TABS
10.0000 mg | ORAL_TABLET | Freq: Every day | ORAL | Status: DC
  Administered 2021-03-01: 10 mg via ORAL
  Filled 2021-03-01: qty 1

## 2021-03-01 MED ORDER — LORAZEPAM 1 MG PO TABS
1.0000 mg | ORAL_TABLET | Freq: Four times a day (QID) | ORAL | Status: DC | PRN
  Administered 2021-03-01: 1 mg via ORAL
  Filled 2021-03-01: qty 1

## 2021-03-01 MED ORDER — QUETIAPINE FUMARATE 200 MG PO TABS
200.0000 mg | ORAL_TABLET | Freq: Every day | ORAL | Status: DC
  Administered 2021-03-01: 200 mg via ORAL
  Filled 2021-03-01: qty 1

## 2021-03-01 NOTE — ED Provider Notes (Signed)
I assumed care of this patient.  Please see previous provider note for further details of Hx, PE.  Briefly patient is a 60 y.o. male who presented for SI under IVC. +EtOH. Noted to have transaminitis - likely EtOH related. Pending hepatitis panel and BHH assessment.  Ordered repeat tylenol level and CMP. Tylenol level wnl Repeat CMP shows significantly improving transaminitis. Hepatitis panel pending.     [facility] recommended inpatient management. Home meds ordered    Nira Conn, MD 03/01/21 [phone]

## 2021-03-01 NOTE — ED Provider Notes (Signed)
Pt's hepatitis panel came back + for hepatitis C.  The pt was d/w Dr. Earlene Plater (ID) said there was nothing acute that needed to be done other than to draw Hep C RNA.  He can follow up as an outpatient once he is discharged from the psych facility.   Jacalyn Lefevre, MD 03/01/21 1056

## 2021-03-01 NOTE — ED Notes (Signed)
Pt expressed concern about missing his appointment at the [facility] to address his substance abuse. Pt was informed that he would be reevaluated in the morning.

## 2021-03-01 NOTE — ED Notes (Signed)
Got patient a ginger ale

## 2021-03-01 NOTE — BH Assessment (Addendum)
Comprehensive Clinical Assessment (CCA) Note  03/01/2021 Fouad Fambrough [id number] Disposition: Clinician discussed patient care with Margorie John, PA.  He recommended inpatient care for patient.  Since patient has VA benefits that location should be sought first.  Patient care recommendation given to Dr. Leonette Monarch and RN Thurmond Butts via secure messaging.  Patient has good eye contact.  He is oriented.  Pt is not responding to internal stimuli.  Patient does not evidence any delusional thought content.  Pt says he sleeps okay but gf said he was not sleeping at all in the last 3 days.    Pt has outpatient psychiatry through the [facility] in [place].  Patient was at the [facility] in [place] a few weeks ago for about four days.     Chief Complaint:  Chief Complaint  Patient presents with   IVC / Suicidal   Visit Diagnosis: ETOH use d/o severe; PTSD    CCA Screening, Triage and Referral (STR)  Patient Reported Information How did you hear about [place]? Legal System (Pt brought to [facility] by [employer])  What Is the Reason for Your Visit/Call Today? Pt says that his girlfriend Laurice Record is gf) said that he was suicidal.  He says he is not.  Pt also denies any past suicide attempts.  Pt denies any HI or any A/V hallucinations.  Pt BAL was 270 at 20:21.  He says that he did drink 4-5 shots of liquor yesterday .  Pt denies use of any other substances.  He says he had been planning on going to the [facility] in [place] to get help for his alcoholism.  Pt says he went to the [facility] in August to address his depression.  Pt says he goes to the [facility] in [place] for outpatient services.  Pt has a psychiatry appt at [facility] on 11/09.  Pt has some depression over his son moving his girlfriend into the house.  He said he did not approve.  The girlfriend has moved out but his son is still in the house.  Pt gave permission to talk to his girlfriend.   She said he is drinking a fifth of liquor a day for a long time.  She said he will make  suicidal statements about cutting his throat.  He has been making statements about this for weeks.  She said that patient needs to be in treatment.  He cannot continue to do this and come back to her home.  "He will be homeless, I cannot tolerate his behavior."  Patient had gone to the [facility] about a month ago and only stayed for about 4 days because he was voluntary.  She said he had not slept in three days.  No weapons in the home.  How Long Has This Been Causing You Problems? > than 6 months  What Do You Feel Would Help You the Most Today? Alcohol or Drug Use Treatment; Treatment for Depression or other mood problem   Have You Recently Had Any Thoughts About Hurting Yourself? No (Pt denies this now.)  Are You Planning to Commit Suicide/Harm Yourself At This time? No (Pt denies any SI now.)   Have you Recently Had Thoughts About [place]? No  Are You Planning to Harm Someone at This Time? No  Explanation: No data recorded  Have You Used Any Alcohol or Drugs in the Past 24 Hours? Yes  How Long Ago Did You Use Drugs or Alcohol? No data recorded What Did You Use and How Much?  Pt had 4-5 shots of liquor on 11/07.  BAL was 270 at 20:21 on 11/07   Do You Currently Have a Therapist/Psychiatrist? Yes  Name of Therapist/Psychiatrist: Psychiatry through [place] in [place].  Appt on 11/09.   Have You Been Recently Discharged From Any Office Practice or Programs? No  Explanation of Discharge From Practice/Program: No data recorded    CCA Screening Triage Referral Assessment Type of Contact: Tele-Assessment  Telemedicine Service Delivery:   Is this Initial or Reassessment? Initial Assessment  Date Telepsych consult ordered in [facility]:  02/28/21  Time Telepsych consult ordered in [facility]:  2058  Location of Assessment: [facility] ED  Provider Location: [facility] Assessment Services   Collateral Involvement: Franchot Mimes [phone]   Does Patient Have a Court  Appointed Legal Guardian? No data recorded Name and Contact of Legal Guardian: No data recorded If Minor and Not Living with Parent(s), Who has Custody? No data recorded Is CPS involved or ever been involved? Never  Is APS involved or ever been involved? Never   Patient Determined To Be At Risk for Harm To Self or Others Based on Review of Patient Reported Information or Presenting Complaint? No (Denies SI, HI)  Method: No data recorded Availability of Means: No data recorded Intent: No data recorded Notification Required: No data recorded Additional Information for Danger to Others Potential: No data recorded Additional Comments for Danger to Others Potential: No data recorded Are There Guns or Other Weapons in Your Home? No data recorded Types of Guns/Weapons: No data recorded Are These Weapons Safely Secured?                            No data recorded Who Could Verify You Are Able To Have These Secured: No data recorded Do You Have any Outstanding Charges, Pending Court Dates, Parole/Probation? No data recorded Contacted To Inform of Risk of Harm To Self or Others: No data recorded   Does Patient Present under Involuntary Commitment? Yes  IVC Papers Initial File Date: 02/28/21   [place] of Residence: [place]   Patient Currently Receiving the Following Services: Medication Management   Determination of Need: Emergent (2 hours)   Options For Referral: Inpatient Hospitalization     CCA Biopsychosocial Patient Reported Schizophrenia/Schizoaffective Diagnosis in Past: No   Strengths: "Cleaning the house, keeping the yard pretty.  Talking to my family "i do have good family support."   Mental Health Symptoms Depression:   Irritability   Duration of Depressive symptoms:  Duration of Depressive Symptoms: Greater than two weeks   Mania:   None   Anxiety:    Worrying; Tension   Psychosis:   None   Duration of Psychotic symptoms:    Trauma:   Re-experience  of traumatic event   Obsessions:   None   Compulsions:   None   Inattention:   None   Hyperactivity/Impulsivity:   None   Oppositional/Defiant Behaviors:   None   Emotional Irregularity:   None   Other Mood/Personality Symptoms:  No data recorded   Mental Status Exam Appearance and self-care  Stature:   Average   Weight:   Average weight   Clothing:   Disheveled   Grooming:   Neglected   Cosmetic use:   None   Posture/gait:   Normal   Motor activity:   Restless   Sensorium  Attention:   Normal   Concentration:   Normal   Orientation:   X5  Recall/memory:   Normal   Affect and Mood  Affect:   Anxious   Mood:   Anxious   Relating  Eye contact:   Normal   Facial expression:   Anxious   Attitude toward examiner:   Cooperative   Thought and Language  Speech flow:  Clear and Coherent; Garbled (Pt is without his dentures.)   Thought content:   Appropriate to Mood and Circumstances   Preoccupation:   None   Hallucinations:   None   Organization:  No data recorded  [employer] of Knowledge:   Fair   Intelligence:   Average   Abstraction:   Normal   Judgement:   Impaired   Reality Testing:   Variable   Insight:   Fair   Decision Making:   Impulsive   Social Functioning  Social Maturity:   Responsible   Social Judgement:   Normal   Stress  Stressors:   Housing   Coping Ability:   Normal   Skill Deficits:   Self-control   Supports:   Family; Friends/Service system     Religion:    Leisure/Recreation:    Exercise/Diet: Exercise/Diet Have You Gained or Lost A Significant Amount of Weight in the Past Six Months?: No Do You Follow a Special Diet?: No Do You Have Any Trouble Sleeping?: No (Takes melatonin occasionally.)   CCA Employment/Education Employment/Work Situation:    Education:     CCA Family/Childhood History Family and Relationship History:     Childhood History:     Child/Adolescent Assessment:     CCA Substance Use Alcohol/Drug Use: Alcohol / Drug Use Pain Medications: None Prescriptions: See PTA medication list Over the Counter: Tylenol or advil History of alcohol / drug use?: Yes Withdrawal Symptoms: Patient aware of relationship between substance abuse and physical/medical complications, Weakness Substance #1 Name of Substance 1: ETOH 1 - Age of First Use: 60 years of age 28 - Amount (size/oz): About half a pint 1 - Frequency: Daily 1 - Duration: ongoing 1 - Last Use / Amount: 11/07 1 - Method of Aquiring: purchase 1- Route of Use: oral                       ASAM's:  Six Dimensions of Multidimensional Assessment  Dimension 1:  Acute Intoxication and/or Withdrawal Potential:      Dimension 2:  Biomedical Conditions and Complications:      Dimension 3:  Emotional, Behavioral, or Cognitive Conditions and Complications:     Dimension 4:  Readiness to Change:     Dimension 5:  Relapse, Continued use, or Continued Problem Potential:     Dimension 6:  Recovery/Living Environment:     ASAM Severity Score:    ASAM Recommended Level of Treatment:     Substance use Disorder (SUD) Substance Use Disorder (SUD)  Checklist Symptoms of Substance Use: Continued use despite having a persistent/recurrent physical/psychological problem caused/exacerbated by use, Continued use despite persistent or recurrent social, interpersonal problems, caused or exacerbated by use, Evidence of tolerance, Large amounts of time spent to obtain, use or recover from the substance(s), Substance(s) often taken in larger amounts or over longer times than was intended  Recommendations for Services/Supports/Treatments: Recommendations for Services/Supports/Treatments Recommendations For Services/Supports/Treatments: Other (Comment), Individual Therapy, Medication Management  Discharge Disposition:    DSM5 Diagnoses: Patient Active  Problem List   Diagnosis Date Noted   Suicidal ideation    Cocaine abuse ([place])    PTSD (post-traumatic stress  disorder) 06/16/2018   Nondisplaced fracture of shaft of right clavicle, initial encounter for closed fracture 02/04/2017   Nondisplaced fracture of middle phalanx of left lesser toe(s), initial encounter for open fracture 02/04/2017   Skull fracture ([place]) 02/03/2017   Cocaine abuse with cocaine-induced mood disorder ([place]) 12/29/2016   Alcohol abuse 12/29/2016     Referrals to Alternative Service(s): Referred to Alternative Service(s):   Place:   Date:   Time:    Referred to Alternative Service(s):   Place:   Date:   Time:    Referred to Alternative Service(s):   Place:   Date:   Time:    Referred to Alternative Service(s):   Place:   Date:   Time:     [place]aldron Session

## 2021-03-01 NOTE — Discharge Instructions (Addendum)
Your primary care doctor needs to follow your blood pressure.  Also it appears if you may have hepatitis C they can be followed as an outpatient.  Go to your appointment at the [facility] today.

## 2021-03-01 NOTE — Progress Notes (Signed)
CSW called [facility] [phone] at capacity per AOD, [facility] [phone] AOD reported to have 1st shift follow back up for availability, [facility] [phone] diversion, [facility] [place] [phone] diversion, [facility] [place] [phone] diversion, and [facility] [place] [phone] has only one bed open and requested that CSW send the referral in the morning because there is no staff available to review the referral tonight and AOD reported that the AOD did not want the referral to be overlooked or misplaced. CSW advised that 1st shift CSW will follow back up.    Maryjean Ka, MSW, [facility] 03/01/2021 11:38 PM

## 2021-03-02 DIAGNOSIS — F1994 Other psychoactive substance use, unspecified with psychoactive substance-induced mood disorder: Secondary | ICD-10-CM | POA: Diagnosis not present

## 2021-03-02 NOTE — ED Provider Notes (Addendum)
  Physical Exam  BP (!) 137/105 (BP Location: Left Arm) Comment: RN aware  Pulse 97   Temp 98.8 F (37.1 C) (Oral)   Resp 20   Ht 5\' 8"  (1.727 m)   Wt 79.4 kg   SpO2 100%   BMI 26.61 kg/m   Physical Exam  ED Course/Procedures     Procedures  MDM  Patient with alcohol abuse. HTN  now. Was normotensive upon arrival. Feels good. No chest pain. No tremor.  Patient states he has had some elevated blood pressure in the past.  Had previously been on antihypertensives.  States that he was taken off them because the thought the blood pressure was due to his alcohol withdrawal not hypertension.  For now we will just continue to follow.  Patient hopes to go to a meeting at the [place] today.       [place], MD 03/02/21 [phone]  Patient has been cleared by psychiatry.  No longer intoxicated.  Not at risk to himself at this time.  Has follow-up this afternoon the [facility].  Will discharge.  Also found to be hepatitis C positive.  Can follow-up as an outpatient.  Viral level pending.  Hypertension also can be followed as an outpatient    [place], MD 03/02/21 [phone]

## 2021-03-02 NOTE — Consult Note (Signed)
Telepsych Consultation   Reason for Consult:  Psychiatric Reassessment Referring Physician:  Cathren Laine Location of Patient:    [facility] Location of Provider: Other: virtual home office  Patient Identification: Kenneth Oneill MRN:  [id number] Principal Diagnosis: Substance induced mood disorder (HCC) Diagnosis:  Principal Problem:   Substance induced mood disorder (HCC) Active Problems:   PTSD (post-traumatic stress disorder)   Total Time spent with patient: 30 minutes  Subjective:   Kenneth Oneill is a 60 y.o. male patient admitted via IVC for suicidal ideations, BAL on arrival was 270.  Patient stated, "I never wanted to kill myself.  I didn't say that."  Patient seen via telepsych by this provider; chart reviewed and consulted with Dr. Lucianne Muss on 03/02/21.  On evaluation Kenneth Oneill is seated in the bed, facing the cameran and appears eager to speak with this Clinical research associate.  He reports a history for alcoholism, states he drinks more than he should but states he has never tried to kill himself and denies making statements or suicidal plans. Also states when he drinks he becomes impulsive and sometimes does not recall everything.  He lives at home alone, but has a girlfriend who previously provided collateral of patient's SI.  When discussed today, patient states his girlfriend is the one with the problem and states she needs mental health care. Of note, patient has been previously evaluated at this facility for similar concerns and polysubstance abuse induced mood disorder, within the past 2 years. Usually has SI with acutely intoxicated that resolves when he is sober.   On arrival, patient's BAL was 270;   AST was 1785 on 11/7, labs show trending down to 1290 on 11/8.  Glucose was elevated at 107. WBCs were WNL. Patient is + for hepatitis but the EDP and consulting service did not think urgent treatment was needed and felt he was safe to follow-up outpatient.    He was placed on CIWA  protocol due to risk for alcohol withdrawals; given thiamine supplements and restarted on his home psychiatric medications, quetiapine  po qhs and citalopram  po daily.  Today, he's cleared up from alcohol intoxication, denies suicidal or homicidal ideations, denies AVH, he does not appear psychotic and is not responding to internal stimulus.  He has been appropriate with staff and no no behavioral concerns.  If discharged today, he relates his plan to follow-up for a scheduled SUD appointment at the [facility] for today at 130pm.     HPI:  Per EDP Admission Assessment  02/28/2021:  Kenneth Oneill , a 60 y.o. male  was evaluated in triage.  Pt presents the emergency department today for further evaluation of suicidal ideations.  Patient has a history of for disorder and PTSD.  Police states he has not slept in 2 days and has been stating he wants to kill himself.  He has been drinking heavily.  He has no somatic complaints at this time.  Past Psychiatric History: as outlined below  Risk to Self:  no Risk to Others:  no Prior Inpatient Therapy: yes  Prior Outpatient Therapy:  yes, connected with Kathryne Sharper, [facility]  Past Medical History:  Past Medical History:  Diagnosis Date   Arthritis    Bipolar disorder (HCC)    Plantar fasciitis, bilateral    PTSD (post-traumatic stress disorder)     Past Surgical History:  Procedure Laterality Date   NERVE, TENDON AND ARTERY REPAIR Right 03/17/2014   Procedure: EXPLORE AND REPAIR RIGHT HAND;  Surgeon: Molli Hazard  Mina Marble, MD;  Location: [facility] OR;  Service: Orthopedics;  Laterality: Right;   Family History: No family history on file. Family Psychiatric  History: unknwon Social History:  Social History   Substance and Sexual Activity  Alcohol Use Yes   Comment: occasionally     Social History   Substance and Sexual Activity  Drug Use Yes   Types: Cocaine   Comment: Denies but per [employer] cocaine use    Social History   Socioeconomic  History   Marital status: Single    Spouse name: Not on file   Number of children: Not on file   Years of education: Not on file   Highest education level: Not on file  Occupational History   Not on file  Tobacco Use   Smoking status: Every Day    Packs/day: 0.50    Types: Cigarettes   Smokeless tobacco: Never  Vaping Use   Vaping Use: Never used  Substance and Sexual Activity   Alcohol use: Yes    Comment: occasionally   Drug use: Yes    Types: Cocaine    Comment: Denies but per [employer] cocaine use   Sexual activity: Yes  Other Topics Concern   Not on file  Social History Narrative   Not on file   Social Determinants of Health   Financial Resource Strain: Not on file  Food Insecurity: Not on file  Transportation Needs: Not on file  Physical Activity: Not on file  Stress: Not on file  Social Connections: Not on file   Additional Social History:    Allergies:  No Known Allergies  Labs:  Results for orders placed or performed during the hospital encounter of 02/28/21 (from the past 48 hour(s))  Rapid urine drug screen (hospital performed)     Status: None   Collection Time: 02/28/21  8:04 PM  Result Value Ref Range   Opiates NONE DETECTED NONE DETECTED   Cocaine NONE DETECTED NONE DETECTED   Benzodiazepines NONE DETECTED NONE DETECTED   Amphetamines NONE DETECTED NONE DETECTED   Tetrahydrocannabinol NONE DETECTED NONE DETECTED   Barbiturates NONE DETECTED NONE DETECTED    Comment: (NOTE) DRUG SCREEN FOR MEDICAL PURPOSES ONLY.  IF CONFIRMATION IS NEEDED FOR ANY PURPOSE, NOTIFY LAB WITHIN 5 DAYS.  LOWEST DETECTABLE LIMITS FOR URINE DRUG SCREEN Drug Class                     Cutoff (ng/mL) Amphetamine and metabolites    1000 Barbiturate and metabolites    200 Benzodiazepine                 200 Tricyclics and metabolites     300 Opiates and metabolites        300 Cocaine and metabolites        300 THC                            50 Performed at [facility], [address] [place], [place] [address]   Comprehensive metabolic panel     Status: Abnormal   Collection Time: 02/28/21  8:21 PM  Result Value Ref Range   Sodium 135 135 - 145 mmol/L   Potassium 4.0 3.5 - 5.1 mmol/L   Chloride 100 98 - 111 mmol/L   CO2 19 (L) 22 - 32 mmol/L   Glucose, Bld 122 (H) 70 - 99 mg/dL    Comment: Glucose reference range applies  only to samples taken after fasting for at least 8 hours.   BUN 16 8 - 23 mg/dL   Creatinine, Ser 1.61 0.61 - 1.24 mg/dL   Calcium 8.5 (L) 8.9 - 10.3 mg/dL   Total Protein 6.7 6.5 - 8.1 g/dL   Albumin 3.6 3.5 - 5.0 g/dL   AST 0,960 (H) 15 - 41 U/L   ALT 323 (H) 0 - 44 U/L   Alkaline Phosphatase 107 38 - 126 U/L   Total Bilirubin 2.7 (H) 0.3 - 1.2 mg/dL   GFR, Estimated >45 >40 mL/min    Comment: (NOTE) Calculated using the CKD-EPI Creatinine Equation (2021)    Anion gap 16 (H) 5 - 15    Comment: Performed at [facility], [address], [place], [place] [address]  Ethanol     Status: Abnormal   Collection Time: 02/28/21  8:21 PM  Result Value Ref Range   Alcohol, Ethyl (B) 270 (H) <10 mg/dL    Comment: (NOTE) Lowest detectable limit for serum alcohol is 10 mg/dL.  For medical purposes only. Performed at [facility], [address] [place], [place] [address]   Salicylate level     Status: Abnormal   Collection Time: 02/28/21  8:21 PM  Result Value Ref Range   Salicylate Lvl <7.0 (L) 7.0 - 30.0 mg/dL    Comment: Performed at [facility], [address] [place], [place] [address]  Acetaminophen level     Status: Abnormal   Collection Time: 02/28/21  8:21 PM  Result Value Ref Range   Acetaminophen (Tylenol), Serum <10 (L) 10 - 30 ug/mL    Comment: (NOTE) Therapeutic concentrations vary significantly. A range of 10-30 ug/mL  may be an effective concentration for many patients. However, some  are best treated at concentrations outside of this range. Acetaminophen concentrations >150  ug/mL at 4 hours after ingestion  and >50 ug/mL at 12 hours after ingestion are often associated with  toxic reactions.  Performed at [facility], [address], [place], [place] [address]   cbc     Status: None   Collection Time: 02/28/21  8:21 PM  Result Value Ref Range   WBC 5.5 4.0 - 10.5 K/uL   RBC 5.07 4.22 - 5.81 MIL/uL   Hemoglobin 17.0 13.0 - 17.0 g/dL   HCT 78.4 69.6 - 29.5 %   MCV 95.3 80.0 - 100.0 fL   MCH 33.5 26.0 - 34.0 pg   MCHC 35.2 30.0 - 36.0 g/dL   RDW 28.4 13.2 - 44.0 %   Platelets 168 150 - 400 K/uL   nRBC 0.0 0.0 - 0.2 %    Comment: Performed at [facility], [address], [place], [place] [address]  Resp Panel by RT-PCR (Flu A&B, Covid) Nasopharyngeal Swab     Status: None   Collection Time: 02/28/21 11:26 PM   Specimen: Nasopharyngeal Swab; Nasopharyngeal(NP) swabs in vial transport medium  Result Value Ref Range   SARS Coronavirus 2 by RT PCR NEGATIVE NEGATIVE    Comment: (NOTE) SARS-CoV-2 target nucleic acids are NOT DETECTED.  The SARS-CoV-2 RNA is generally detectable in upper respiratory specimens during the acute phase of infection. The lowest concentration of SARS-CoV-2 viral copies this assay can detect is 138 copies/mL. A negative result does not preclude SARS-Cov-2 infection and should not be used as the sole basis for treatment or other patient management decisions. A negative result may occur with  improper  specimen collection/handling, submission of specimen other than nasopharyngeal swab, presence of viral mutation(s) within the areas targeted by this assay, and inadequate number of viral copies(<138 copies/mL). A negative result must be combined with clinical observations, patient history, and epidemiological information. The expected result is Negative.  Fact Sheet for Patients:  BloggerCourse.com  Fact Sheet for Healthcare Providers:  SeriousBroker.it  This test  is no t yet approved or cleared by the [place] FDA and  has been authorized for detection and/or diagnosis of SARS-CoV-2 by FDA under an Emergency Use Authorization (EUA). This EUA will remain  in effect (meaning this test can be used) for the duration of the COVID-19 declaration under Section 564(b)(1) of the Act, 21 U.S.C.section 360bbb-3(b)(1), unless the authorization is terminated  or revoked sooner.       Influenza A by PCR NEGATIVE NEGATIVE   Influenza B by PCR NEGATIVE NEGATIVE    Comment: (NOTE) The Xpert Xpress SARS-CoV-2/FLU/RSV plus assay is intended as an aid in the diagnosis of influenza from Nasopharyngeal swab specimens and should not be used as a sole basis for treatment. Nasal washings and aspirates are unacceptable for Xpert Xpress SARS-CoV-2/FLU/RSV testing.  Fact Sheet for Patients: BloggerCourse.com  Fact Sheet for Healthcare Providers: SeriousBroker.it  This test is not yet approved or cleared by the [place] FDA and has been authorized for detection and/or diagnosis of SARS-CoV-2 by FDA under an Emergency Use Authorization (EUA). This EUA will remain in effect (meaning this test can be used) for the duration of the COVID-19 declaration under Section 564(b)(1) of the Act, 21 U.S.C. section 360bbb-3(b)(1), unless the authorization is terminated or revoked.  Performed at [facility], [address], [place], [place] [address]   Hepatitis panel, acute     Status: Abnormal   Collection Time: 03/01/21  3:10 AM  Result Value Ref Range   Hepatitis B Surface Ag NON REACTIVE NON REACTIVE   HCV Ab Reactive (A) NON REACTIVE    Comment: (NOTE) The CDC recommends that a Reactive HCV antibody result be followed up  with a HCV Nucleic Acid Amplification test.     Hep A IgM NON REACTIVE NON REACTIVE   Hep B C IgM NON REACTIVE NON REACTIVE    Comment: Performed at [facility], [address] [place], [place] [address]  Acetaminophen level     Status: Abnormal   Collection Time: 03/01/21  3:10 AM  Result Value Ref Range   Acetaminophen (Tylenol), Serum <10 (L) 10 - 30 ug/mL    Comment: (NOTE) Therapeutic concentrations vary significantly. A range of 10-30 ug/mL  may be an effective concentration for many patients. However, some  are best treated at concentrations outside of this range. Acetaminophen concentrations >150 ug/mL at 4 hours after ingestion  and >50 ug/mL at 12 hours after ingestion are often associated with  toxic reactions.  Performed at [facility], [address] [place], [place] [address]   Comprehensive metabolic panel     Status: Abnormal   Collection Time: 03/01/21  3:10 AM  Result Value Ref Range   Sodium 135 135 - 145 mmol/L   Potassium 3.7 3.5 - 5.1 mmol/L   Chloride 101 98 - 111 mmol/L   CO2 21 (L) 22 - 32 mmol/L   Glucose, Bld 107 (H) 70 - 99 mg/dL    Comment: Glucose reference range applies only to samples taken after fasting for at least 8 hours.   BUN 14 8 -  23 mg/dL   Creatinine, Ser 1.61 0.61 - 1.24 mg/dL   Calcium 8.2 (L) 8.9 - 10.3 mg/dL   Total Protein 5.6 (L) 6.5 - 8.1 g/dL   Albumin 2.9 (L) 3.5 - 5.0 g/dL   AST 0,960 (H) 15 - 41 U/L   ALT 279 (H) 0 - 44 U/L   Alkaline Phosphatase 91 38 - 126 U/L   Total Bilirubin 3.1 (H) 0.3 - 1.2 mg/dL   GFR, Estimated >45 >40 mL/min    Comment: (NOTE) Calculated using the CKD-EPI Creatinine Equation (2021)    Anion gap 13 5 - 15    Comment: Performed at [facility], [address], [place], [place] [address]    Medications:  Current Facility-Administered Medications  Medication Dose Route Frequency Provider Last Rate Last Admin   citalopram (CELEXA) tablet 10 mg  10 mg Oral Daily Cardama, Amadeo Garnet, MD   10 mg at 03/01/21 1003   LORazepam (ATIVAN) injection 0-4 mg  0-4 mg Intravenous Q6H Cathren Laine, MD       Or   LORazepam (ATIVAN) tablet 0-4 mg  0-4 mg Oral Q6H  Cathren Laine, MD       [START ON 03/03/2021] LORazepam (ATIVAN) injection 0-4 mg  0-4 mg Intravenous Q12H Cathren Laine, MD       Or   Melene Muller ON 03/03/2021] LORazepam (ATIVAN) tablet 0-4 mg  0-4 mg Oral Q12H Cathren Laine, MD       LORazepam (ATIVAN) tablet 1 mg  1 mg Oral Q6H PRN Jacalyn Lefevre, MD   1 mg at 03/01/21 1006   QUEtiapine (SEROQUEL) tablet 200 mg  200 mg Oral QHS Nira Conn, MD   200 mg at 03/01/21 2105   thiamine tablet 100 mg  100 mg Oral Daily Cathren Laine, MD   100 mg at 03/01/21 1003   Or   thiamine (B-1) injection 100 mg  100 mg Intravenous Daily Cathren Laine, MD       Current Outpatient Medications  Medication Sig Dispense Refill   Calcium Polycarbophil (FIBER) 625 MG TABS Take 2 tablets by mouth daily.     citalopram (CELEXA) 20 MG tablet Take 0.5 tablets (10 mg total) by mouth daily. 30 tablet 0   folic acid (FOLVITE) 1 MG tablet Take 1 mg by mouth daily.     gabapentin (NEURONTIN) 300 MG capsule Take 300 mg by mouth 3 (three) times daily.     Multiple Vitamins-Minerals (MULTIVITAMIN ADULTS 50+) TABS Take 1 tablet by mouth daily.     naloxone (NARCAN) nasal spray 4 mg/0.1 mL Place 1 spray into the nose daily as needed (opioid overdose).     OLANZapine (ZYPREXA) 10 MG tablet Take 10 mg by mouth.     potassium chloride SA (KLOR-CON) 20 MEQ tablet Take 20 mEq by mouth with breakfast, with lunch, and with evening meal.     QUEtiapine (SEROQUEL) 200 MG tablet Take 1 tablet (200 mg total) by mouth at bedtime. 30 tablet 0   vitamin B-12 (CYANOCOBALAMIN) 100 MCG tablet Take 100 mcg by mouth daily.      Musculoskeletal: Strength & Muscle Tone: within normal limits Gait & Station: normal Patient leans: N/A   Psychiatric Specialty Exam:  Presentation  General Appearance: Appropriate for Environment; Casual; Fairly Groomed  Eye Contact:Good  Speech:Clear and Coherent; Normal Rate  Speech Volume:Normal  Handedness:Right   Mood and Affect   Mood:Euthymic  Affect:Appropriate; Congruent   Thought Process  Thought Processes:Coherent; Goal Directed  Descriptions of Associations:No data recorded Orientation:No data recorded Thought Content:Logical  History of Schizophrenia/Schizoaffective disorder:No  Duration of Psychotic Symptoms:No data recorded Hallucinations:Hallucinations: None  Ideas of Reference:No data recorded Suicidal Thoughts:Suicidal Thoughts: No  Homicidal Thoughts:Homicidal Thoughts: No   Sensorium  Memory:Immediate Good; Recent Good; Remote Good  Judgment:Good (good today but can be impulsive with alcohol use)  Insight:Fair   Executive Functions  Concentration:Good  Attention Span:Fair  Recall:Good  Fund of Knowledge:Good  Language:Good   Psychomotor Activity  Psychomotor Activity:Psychomotor Activity: Normal   Assets  Assets:Communication Skills; Desire for Improvement; Housing; Social Support   Sleep  Sleep:Sleep: Good Number of Hours of Sleep: 8    Physical Exam: Physical Exam Constitutional:      Appearance: Normal appearance.  Cardiovascular:     Rate and Rhythm: Normal rate.     Pulses: Normal pulses.  Pulmonary:     Effort: Pulmonary effort is normal.  Musculoskeletal:        General: Normal range of motion.     Cervical back: Normal range of motion.  Neurological:     General: No focal deficit present.     Mental Status: He is alert and oriented to person, place, and time.  Psychiatric:        Attention and Perception: Attention and perception normal.        Mood and Affect: Mood normal.        Speech: Speech normal.        Behavior: Behavior normal.        Thought Content: Thought content normal. Thought content does not include homicidal or suicidal ideation. Thought content does not include homicidal or suicidal plan.        Cognition and Memory: Cognition normal.        Judgment: Judgment normal.   Review of Systems  Constitutional: Negative.    HENT: Negative.    Eyes: Negative.   Respiratory: Negative.    Cardiovascular: Negative.   Gastrointestinal: Negative.   Genitourinary: Negative.   Skin: Negative.   Neurological:  Negative for tremors, seizures, weakness and headaches.  Endo/Heme/Allergies: Negative.   Psychiatric/Behavioral:  Positive for substance abuse. Negative for depression and suicidal ideas. The patient is not nervous/anxious.   Blood pressure (!) 146/110, pulse 100, temperature 99.1 F (37.3 C), temperature source Oral, resp. rate 20, height 5\' 8"  (1.727 m), weight 79.4 kg, SpO2 98 %. Body mass index is 26.61 kg/m.  Treatment Plan Summary: Plan- As per above assessment, there are no current grounds for involuntary commitment at this time.  He is future oriented and relates his plan to follow-up with for outpatient SUD appointment today at 130pm.  Will ask SW to reach out to [facility] for continuity of care.   Patient is not currently interested in inpatient services but expresses agreement to continue outpatient treatment., we have reviewed importance of substance abuse abstinence, potential negative impact substance abuse can have on his relationships and level of functioning, and importance of medication compliance.  Disposition: No evidence of imminent risk to self or others at present.   Patient does not meet criteria for psychiatric inpatient admission. Supportive therapy provided about ongoing stressors. Discussed crisis plan, support from social network, calling 911, coming to the Emergency Department, and calling Suicide Hotline.  Spoke with Dr. [place], EDP; Johnney Ou, RN; Kathlene Cote, LCSW.  All were informed of above recommendation and disposition.  This service was provided via telemedicine using a 2-way, interactive audio and video technology.  Names of all persons participating in this telemedicine service and their role in this encounter. Name: Kenneth Oneill Role:  Patient  Name: Ophelia Shoulder Role: PMHNP    Chales Abrahams, NP 03/02/2021 9:23 AM

## 2021-03-03 LAB — HCV RNA QUANT: HCV Quantitative: NOT DETECTED IU/mL (ref >50)

## 2021-09-16 ENCOUNTER — Inpatient Hospital Stay (HOSPITAL_COMMUNITY)
Admission: EM | Admit: 2021-09-16 | Discharge: 2021-09-22 | DRG: 917 | Disposition: E | Payer: No Typology Code available for payment source | Attending: Internal Medicine | Admitting: Internal Medicine

## 2021-09-16 ENCOUNTER — Emergency Department (HOSPITAL_COMMUNITY): Payer: No Typology Code available for payment source

## 2021-09-16 ENCOUNTER — Inpatient Hospital Stay (HOSPITAL_COMMUNITY): Payer: No Typology Code available for payment source

## 2021-09-16 ENCOUNTER — Encounter (HOSPITAL_COMMUNITY): Payer: Self-pay | Admitting: Emergency Medicine

## 2021-09-16 ENCOUNTER — Other Ambulatory Visit: Payer: Self-pay

## 2021-09-16 DIAGNOSIS — G40409 Other generalized epilepsy and epileptic syndromes, not intractable, without status epilepticus: Secondary | ICD-10-CM | POA: Diagnosis not present

## 2021-09-16 DIAGNOSIS — I248 Other forms of acute ischemic heart disease: Secondary | ICD-10-CM | POA: Diagnosis not present

## 2021-09-16 DIAGNOSIS — D696 Thrombocytopenia, unspecified: Secondary | ICD-10-CM | POA: Diagnosis present

## 2021-09-16 DIAGNOSIS — I469 Cardiac arrest, cause unspecified: Secondary | ICD-10-CM

## 2021-09-16 DIAGNOSIS — G40901 Epilepsy, unspecified, not intractable, with status epilepticus: Secondary | ICD-10-CM | POA: Diagnosis not present

## 2021-09-16 DIAGNOSIS — Z66 Do not resuscitate: Secondary | ICD-10-CM | POA: Diagnosis not present

## 2021-09-16 DIAGNOSIS — G9340 Encephalopathy, unspecified: Secondary | ICD-10-CM

## 2021-09-16 DIAGNOSIS — F4310 Post-traumatic stress disorder, unspecified: Secondary | ICD-10-CM | POA: Diagnosis present

## 2021-09-16 DIAGNOSIS — N179 Acute kidney failure, unspecified: Secondary | ICD-10-CM | POA: Diagnosis present

## 2021-09-16 DIAGNOSIS — Z79899 Other long term (current) drug therapy: Secondary | ICD-10-CM

## 2021-09-16 DIAGNOSIS — J9600 Acute respiratory failure, unspecified whether with hypoxia or hypercapnia: Secondary | ICD-10-CM

## 2021-09-16 DIAGNOSIS — D6489 Other specified anemias: Secondary | ICD-10-CM | POA: Diagnosis present

## 2021-09-16 DIAGNOSIS — I959 Hypotension, unspecified: Secondary | ICD-10-CM | POA: Diagnosis not present

## 2021-09-16 DIAGNOSIS — T401X1A Poisoning by heroin, accidental (unintentional), initial encounter: Secondary | ICD-10-CM | POA: Diagnosis present

## 2021-09-16 DIAGNOSIS — J9601 Acute respiratory failure with hypoxia: Secondary | ICD-10-CM | POA: Diagnosis present

## 2021-09-16 DIAGNOSIS — G936 Cerebral edema: Secondary | ICD-10-CM | POA: Diagnosis present

## 2021-09-16 DIAGNOSIS — F1011 Alcohol abuse, in remission: Secondary | ICD-10-CM | POA: Diagnosis present

## 2021-09-16 DIAGNOSIS — Z515 Encounter for palliative care: Secondary | ICD-10-CM | POA: Diagnosis not present

## 2021-09-16 DIAGNOSIS — G931 Anoxic brain damage, not elsewhere classified: Secondary | ICD-10-CM | POA: Diagnosis not present

## 2021-09-16 DIAGNOSIS — L89152 Pressure ulcer of sacral region, stage 2: Secondary | ICD-10-CM | POA: Diagnosis present

## 2021-09-16 DIAGNOSIS — E162 Hypoglycemia, unspecified: Secondary | ICD-10-CM | POA: Diagnosis not present

## 2021-09-16 DIAGNOSIS — Z7189 Other specified counseling: Secondary | ICD-10-CM

## 2021-09-16 DIAGNOSIS — E872 Acidosis, unspecified: Secondary | ICD-10-CM | POA: Diagnosis present

## 2021-09-16 DIAGNOSIS — I4891 Unspecified atrial fibrillation: Secondary | ICD-10-CM | POA: Diagnosis present

## 2021-09-16 DIAGNOSIS — G253 Myoclonus: Secondary | ICD-10-CM | POA: Diagnosis not present

## 2021-09-16 DIAGNOSIS — Z20822 Contact with and (suspected) exposure to covid-19: Secondary | ICD-10-CM | POA: Diagnosis present

## 2021-09-16 DIAGNOSIS — M1990 Unspecified osteoarthritis, unspecified site: Secondary | ICD-10-CM | POA: Diagnosis present

## 2021-09-16 DIAGNOSIS — E876 Hypokalemia: Secondary | ICD-10-CM | POA: Diagnosis present

## 2021-09-16 DIAGNOSIS — F319 Bipolar disorder, unspecified: Secondary | ICD-10-CM | POA: Diagnosis present

## 2021-09-16 DIAGNOSIS — I468 Cardiac arrest due to other underlying condition: Secondary | ICD-10-CM | POA: Diagnosis present

## 2021-09-16 DIAGNOSIS — J690 Pneumonitis due to inhalation of food and vomit: Secondary | ICD-10-CM | POA: Diagnosis present

## 2021-09-16 DIAGNOSIS — L8990 Pressure ulcer of unspecified site, unspecified stage: Secondary | ICD-10-CM | POA: Insufficient documentation

## 2021-09-16 LAB — I-STAT ARTERIAL BLOOD GAS, ED
Acid-base deficit: 7 mmol/L — ABNORMAL HIGH (ref 0.0–2.0)
Bicarbonate: 20.4 mmol/L (ref 20.0–28.0)
Calcium, Ion: 1.1 mmol/L — ABNORMAL LOW (ref 1.15–1.40)
HCT: 38 % — ABNORMAL LOW (ref 39.0–52.0)
Hemoglobin: 12.9 g/dL — ABNORMAL LOW (ref 13.0–17.0)
O2 Saturation: 100 %
Patient temperature: 96.5
Potassium: 3.6 mmol/L (ref 3.5–5.1)
Sodium: 140 mmol/L (ref 135–145)
TCO2: 22 mmol/L (ref 22–32)
pCO2 arterial: 42.7 mmHg (ref 32–48)
pH, Arterial: 7.281 — ABNORMAL LOW (ref 7.35–7.45)
pO2, Arterial: 431 mmHg — ABNORMAL HIGH (ref 83–108)

## 2021-09-16 LAB — CBC WITH DIFFERENTIAL/PLATELET
Abs Immature Granulocytes: 0.4 10*3/uL — ABNORMAL HIGH (ref 0.00–0.07)
Basophils Absolute: 0 10*3/uL (ref 0.0–0.1)
Basophils Relative: 0 %
Eosinophils Absolute: 0 10*3/uL (ref 0.0–0.5)
Eosinophils Relative: 0 %
HCT: 38.7 % — ABNORMAL LOW (ref 39.0–52.0)
Hemoglobin: 12.5 g/dL — ABNORMAL LOW (ref 13.0–17.0)
Lymphocytes Relative: 32 %
Lymphs Abs: 4.2 10*3/uL — ABNORMAL HIGH (ref 0.7–4.0)
MCH: 33 pg (ref 26.0–34.0)
MCHC: 32.3 g/dL (ref 30.0–36.0)
MCV: 102.1 fL — ABNORMAL HIGH (ref 80.0–100.0)
Metamyelocytes Relative: 3 %
Monocytes Absolute: 0.7 10*3/uL (ref 0.1–1.0)
Monocytes Relative: 5 %
Neutro Abs: 7.8 10*3/uL — ABNORMAL HIGH (ref 1.7–7.7)
Neutrophils Relative %: 60 %
Platelets: 157 10*3/uL (ref 150–400)
RBC: 3.79 MIL/uL — ABNORMAL LOW (ref 4.22–5.81)
RDW: 13.3 % (ref 11.5–15.5)
WBC: 13 10*3/uL — ABNORMAL HIGH (ref 4.0–10.5)
nRBC: 0.3 % — ABNORMAL HIGH (ref 0.0–0.2)
nRBC: 1 /100 WBC — ABNORMAL HIGH

## 2021-09-16 LAB — SARS CORONAVIRUS 2 BY RT PCR: SARS Coronavirus 2 by RT PCR: NEGATIVE

## 2021-09-16 LAB — CBC
HCT: 39.3 % (ref 39.0–52.0)
HCT: 41 % (ref 39.0–52.0)
Hemoglobin: 12.9 g/dL — ABNORMAL LOW (ref 13.0–17.0)
Hemoglobin: 12.7 g/dL — ABNORMAL LOW (ref 13.0–17.0)
MCH: 32.8 pg (ref 26.0–34.0)
MCH: 31.8 pg (ref 26.0–34.0)
MCHC: 32.8 g/dL (ref 30.0–36.0)
MCHC: 31 g/dL (ref 30.0–36.0)
MCV: 100 fL (ref 80.0–100.0)
MCV: 102.5 fL — ABNORMAL HIGH (ref 80.0–100.0)
Platelets: 146 10*3/uL — ABNORMAL LOW (ref 150–400)
Platelets: 137 10*3/uL — ABNORMAL LOW (ref 150–400)
RBC: 3.93 MIL/uL — ABNORMAL LOW (ref 4.22–5.81)
RBC: 4 MIL/uL — ABNORMAL LOW (ref 4.22–5.81)
RDW: 13.2 % (ref 11.5–15.5)
RDW: 13.7 % (ref 11.5–15.5)
WBC: 10.8 10*3/uL — ABNORMAL HIGH (ref 4.0–10.5)
WBC: 14.6 10*3/uL — ABNORMAL HIGH (ref 4.0–10.5)
nRBC: 0 % (ref 0.0–0.2)
nRBC: 0 % (ref 0.0–0.2)

## 2021-09-16 LAB — COMPREHENSIVE METABOLIC PANEL
ALT: 49 U/L — ABNORMAL HIGH (ref 0–44)
AST: 88 U/L — ABNORMAL HIGH (ref 15–41)
Albumin: 2.5 g/dL — ABNORMAL LOW (ref 3.5–5.0)
Alkaline Phosphatase: 69 U/L (ref 38–126)
Anion gap: 19 — ABNORMAL HIGH (ref 5–15)
BUN: 12 mg/dL (ref 8–23)
CO2: 15 mmol/L — ABNORMAL LOW (ref 22–32)
Calcium: 7 mg/dL — ABNORMAL LOW (ref 8.9–10.3)
Chloride: 108 mmol/L (ref 98–111)
Creatinine, Ser: 1.37 mg/dL — ABNORMAL HIGH (ref 0.61–1.24)
GFR, Estimated: 58 mL/min — ABNORMAL LOW (ref >60)
Glucose, Bld: 183 mg/dL — ABNORMAL HIGH (ref 70–99)
Potassium: 3.1 mmol/L — ABNORMAL LOW (ref 3.5–5.1)
Sodium: 142 mmol/L (ref 135–145)
Total Bilirubin: 0.2 mg/dL — ABNORMAL LOW (ref 0.3–1.2)
Total Protein: 4.7 g/dL — ABNORMAL LOW (ref 6.5–8.1)

## 2021-09-16 LAB — I-STAT CHEM 8, ED
BUN: 13 mg/dL (ref 8–23)
Calcium, Ion: 0.97 mmol/L — ABNORMAL LOW (ref 1.15–1.40)
Chloride: 106 mmol/L (ref 98–111)
Creatinine, Ser: 1.4 mg/dL — ABNORMAL HIGH (ref 0.61–1.24)
Glucose, Bld: 180 mg/dL — ABNORMAL HIGH (ref 70–99)
HCT: 37 % — ABNORMAL LOW (ref 39.0–52.0)
Hemoglobin: 12.6 g/dL — ABNORMAL LOW (ref 13.0–17.0)
Potassium: 3 mmol/L — ABNORMAL LOW (ref 3.5–5.1)
Sodium: 144 mmol/L (ref 135–145)
TCO2: 20 mmol/L — ABNORMAL LOW (ref 22–32)

## 2021-09-16 LAB — CBG MONITORING, ED: Glucose-Capillary: 154 mg/dL — ABNORMAL HIGH (ref 70–99)

## 2021-09-16 LAB — HEMOGLOBIN A1C
Hgb A1c MFr Bld: 4.7 % — ABNORMAL LOW (ref 4.8–5.6)
Mean Plasma Glucose: 88.19 mg/dL

## 2021-09-16 LAB — HIV ANTIBODY (ROUTINE TESTING W REFLEX): HIV Screen 4th Generation wRfx: NONREACTIVE

## 2021-09-16 LAB — BASIC METABOLIC PANEL
Anion gap: 12 (ref 5–15)
BUN: 19 mg/dL (ref 8–23)
CO2: 22 mmol/L (ref 22–32)
Calcium: 7.6 mg/dL — ABNORMAL LOW (ref 8.9–10.3)
Chloride: 111 mmol/L (ref 98–111)
Creatinine, Ser: 1.5 mg/dL — ABNORMAL HIGH (ref 0.61–1.24)
GFR, Estimated: 52 mL/min — ABNORMAL LOW (ref >60)
Glucose, Bld: 80 mg/dL (ref 70–99)
Potassium: 4.6 mmol/L (ref 3.5–5.1)
Sodium: 145 mmol/L (ref 135–145)

## 2021-09-16 LAB — ACETAMINOPHEN LEVEL: Acetaminophen (Tylenol), Serum: <10 ug/mL — ABNORMAL LOW (ref 10–30)

## 2021-09-16 LAB — LACTIC ACID, PLASMA
Lactic Acid, Venous: >9 mmol/L (ref 0.5–1.9)
Lactic Acid, Venous: 5.1 mmol/L (ref 0.5–1.9)
Lactic Acid, Venous: 1.3 mmol/L (ref 0.5–1.9)

## 2021-09-16 LAB — GLUCOSE, CAPILLARY
Glucose-Capillary: 100 mg/dL — ABNORMAL HIGH (ref 70–99)
Glucose-Capillary: 105 mg/dL — ABNORMAL HIGH (ref 70–99)
Glucose-Capillary: 83 mg/dL (ref 70–99)
Glucose-Capillary: 78 mg/dL (ref 70–99)

## 2021-09-16 LAB — RAPID URINE DRUG SCREEN, HOSP PERFORMED
Amphetamines: NOT DETECTED
Barbiturates: NOT DETECTED
Benzodiazepines: NOT DETECTED
Cocaine: NOT DETECTED
Opiates: POSITIVE — AB
Tetrahydrocannabinol: POSITIVE — AB

## 2021-09-16 LAB — ECHOCARDIOGRAM COMPLETE
AR max vel: 2.84 cm2
AV Peak grad: 6.3 mmHg
Ao pk vel: 1.25 m/s
Area-P 1/2: 4.46 cm2
Height: 66 in
S' Lateral: 3 cm
Weight: 2800.72 oz

## 2021-09-16 LAB — TROPONIN I (HIGH SENSITIVITY)
Troponin I (High Sensitivity): 33 ng/L — ABNORMAL HIGH (ref <18)
Troponin I (High Sensitivity): 539 ng/L (ref <18)

## 2021-09-16 LAB — MRSA NEXT GEN BY PCR, NASAL: MRSA by PCR Next Gen: NOT DETECTED

## 2021-09-16 LAB — MAGNESIUM
Magnesium: 1.9 mg/dL (ref 1.7–2.4)
Magnesium: 1.7 mg/dL (ref 1.7–2.4)

## 2021-09-16 LAB — SALICYLATE LEVEL: Salicylate Lvl: <7 mg/dL — ABNORMAL LOW (ref 7.0–30.0)

## 2021-09-16 LAB — ETHANOL: Alcohol, Ethyl (B): 86 mg/dL — ABNORMAL HIGH (ref <10)

## 2021-09-16 LAB — CREATININE, SERUM
Creatinine, Ser: 1.47 mg/dL — ABNORMAL HIGH (ref 0.61–1.24)
GFR, Estimated: 54 mL/min — ABNORMAL LOW (ref >60)

## 2021-09-16 MED ORDER — MIDAZOLAM HCL 2 MG/2ML IJ SOLN
2.0000 mg | INTRAMUSCULAR | Status: DC | PRN
  Administered 2021-09-16 (×4): 2 mg via INTRAVENOUS
  Filled 2021-09-16 (×5): qty 2

## 2021-09-16 MED ORDER — MIDAZOLAM HCL 2 MG/2ML IJ SOLN
INTRAMUSCULAR | Status: AC
  Filled 2021-09-16: qty 2

## 2021-09-16 MED ORDER — SODIUM BICARBONATE 8.4 % IV SOLN
100.0000 meq | Freq: Once | INTRAVENOUS | Status: DC
  Filled 2021-09-16: qty 50

## 2021-09-16 MED ORDER — DOCUSATE SODIUM 50 MG/5ML PO LIQD
100.0000 mg | Freq: Two times a day (BID) | ORAL | Status: DC
  Administered 2021-09-17 – 2021-09-20 (×6): 100 mg
  Filled 2021-09-16 (×7): qty 10

## 2021-09-16 MED ORDER — SODIUM CHLORIDE 0.9 % IV SOLN
4000.0000 mg | Freq: Once | INTRAVENOUS | Status: AC
  Administered 2021-09-16: 4000 mg via INTRAVENOUS
  Filled 2021-09-16: qty 40

## 2021-09-16 MED ORDER — ARTIFICIAL TEARS OPHTHALMIC OINT
TOPICAL_OINTMENT | OPHTHALMIC | Status: DC | PRN
  Administered 2021-09-16 – 2021-09-19 (×4): 1 via OPHTHALMIC
  Filled 2021-09-16: qty 3.5

## 2021-09-16 MED ORDER — PANTOPRAZOLE SODIUM 40 MG PO TBEC
40.0000 mg | DELAYED_RELEASE_TABLET | Freq: Every day | ORAL | Status: DC
  Administered 2021-09-16: 40 mg via ORAL
  Filled 2021-09-16: qty 1

## 2021-09-16 MED ORDER — DOCUSATE SODIUM 100 MG PO CAPS: 100.0000 mg | ORAL_CAPSULE | Freq: Two times a day (BID) | ORAL | Status: DC | PRN

## 2021-09-16 MED ORDER — HEPARIN SODIUM (PORCINE) 5000 UNIT/ML IJ SOLN
5000.0000 [IU] | Freq: Three times a day (TID) | INTRAMUSCULAR | Status: DC
  Administered 2021-09-16 – 2021-09-20 (×13): 5000 [IU] via SUBCUTANEOUS
  Filled 2021-09-16 (×13): qty 1

## 2021-09-16 MED ORDER — FENTANYL 2500MCG IN NS 250ML (10MCG/ML) PREMIX INFUSION
0.0000 ug/h | INTRAVENOUS | Status: DC
  Administered 2021-09-16: 50 ug/h via INTRAVENOUS
  Administered 2021-09-17: 125 ug/h via INTRAVENOUS
  Administered 2021-09-17: 150 ug/h via INTRAVENOUS
  Administered 2021-09-18 – 2021-09-20 (×2): 75 ug/h via INTRAVENOUS
  Filled 2021-09-16 (×5): qty 250

## 2021-09-16 MED ORDER — POLYETHYLENE GLYCOL 3350 17 G PO PACK
17.0000 g | PACK | Freq: Every day | ORAL | Status: DC
  Administered 2021-09-18 – 2021-09-20 (×3): 17 g
  Filled 2021-09-16 (×3): qty 1

## 2021-09-16 MED ORDER — SODIUM CHLORIDE 0.9 % IV SOLN
3.0000 g | Freq: Four times a day (QID) | INTRAVENOUS | Status: DC
  Administered 2021-09-16 – 2021-09-20 (×17): 3 g via INTRAVENOUS
  Filled 2021-09-16 (×17): qty 8

## 2021-09-16 MED ORDER — LORAZEPAM 2 MG/ML IJ SOLN
INTRAMUSCULAR | Status: AC
Start: 2021-09-16 — End: 2021-09-16
  Administered 2021-09-16: 2 mg via INTRAVENOUS
  Filled 2021-09-16: qty 1

## 2021-09-16 MED ORDER — POTASSIUM CHLORIDE 20 MEQ PO PACK
40.0000 meq | PACK | ORAL | Status: AC
  Administered 2021-09-16: 40 meq via ORAL
  Filled 2021-09-16: qty 2

## 2021-09-16 MED ORDER — LACTATED RINGERS IV SOLN
INTRAVENOUS | Status: DC
  Administered 2021-09-16: 125 mL/h via INTRAVENOUS

## 2021-09-16 MED ORDER — CHLORHEXIDINE GLUCONATE CLOTH 2 % EX PADS
6.0000 | MEDICATED_PAD | Freq: Every day | CUTANEOUS | Status: DC
  Administered 2021-09-16 – 2021-09-19 (×5): 6 via TOPICAL

## 2021-09-16 MED ORDER — PROPOFOL 1000 MG/100ML IV EMUL
INTRAVENOUS | Status: AC
  Administered 2021-09-16: 10 ug/kg/min via INTRAVENOUS
  Filled 2021-09-16: qty 100

## 2021-09-16 MED ORDER — THIAMINE HCL 100 MG/ML IJ SOLN
100.0000 mg | Freq: Every day | INTRAMUSCULAR | Status: DC
  Administered 2021-09-16 – 2021-09-20 (×5): 100 mg via INTRAVENOUS
  Filled 2021-09-16 (×6): qty 2

## 2021-09-16 MED ORDER — ORAL CARE MOUTH RINSE: 15.0000 mL | Freq: Two times a day (BID) | OROMUCOSAL | Status: DC

## 2021-09-16 MED ORDER — IOHEXOL 350 MG/ML SOLN
100.0000 mL | Freq: Once | INTRAVENOUS | Status: AC | PRN
  Administered 2021-09-16: 100 mL via INTRAVENOUS

## 2021-09-16 MED ORDER — LEVETIRACETAM IN NACL 1500 MG/100ML IV SOLN: 1500.0000 mg | Freq: Two times a day (BID) | INTRAVENOUS | Status: DC

## 2021-09-16 MED ORDER — LORAZEPAM 2 MG/ML IJ SOLN: 2.0000 mg | Freq: Once | INTRAMUSCULAR | Status: AC

## 2021-09-16 MED ORDER — INSULIN ASPART 100 UNIT/ML IJ SOLN
0.0000 [IU] | INTRAMUSCULAR | Status: DC
  Administered 2021-09-18 – 2021-09-19 (×9): 2 [IU] via SUBCUTANEOUS

## 2021-09-16 MED ORDER — POLYETHYLENE GLYCOL 3350 17 G PO PACK: 17.0000 g | PACK | Freq: Every day | ORAL | Status: DC | PRN

## 2021-09-16 MED ORDER — PROPOFOL 1000 MG/100ML IV EMUL
0.0000 ug/kg/min | INTRAVENOUS | Status: DC
  Administered 2021-09-16: 10 ug/kg/min via INTRAVENOUS
  Administered 2021-09-17: 20 ug/kg/min via INTRAVENOUS
  Administered 2021-09-17: 30 ug/kg/min via INTRAVENOUS
  Administered 2021-09-17: 15 ug/kg/min via INTRAVENOUS
  Administered 2021-09-17 – 2021-09-18 (×5): 30 ug/kg/min via INTRAVENOUS
  Administered 2021-09-19: 25 ug/kg/min via INTRAVENOUS
  Administered 2021-09-19: 30 ug/kg/min via INTRAVENOUS
  Administered 2021-09-19 – 2021-09-20 (×2): 20 ug/kg/min via INTRAVENOUS
  Filled 2021-09-16 (×13): qty 100

## 2021-09-16 MED ORDER — CHLORHEXIDINE GLUCONATE 0.12 % MT SOLN
15.0000 mL | Freq: Two times a day (BID) | OROMUCOSAL | Status: DC
  Administered 2021-09-16: 15 mL via OROMUCOSAL

## 2021-09-16 MED ORDER — LEVETIRACETAM IN NACL 1000 MG/100ML IV SOLN
1000.0000 mg | Freq: Two times a day (BID) | INTRAVENOUS | Status: DC
  Administered 2021-09-16 – 2021-09-20 (×8): 1000 mg via INTRAVENOUS
  Filled 2021-09-16 (×8): qty 100

## 2021-09-16 MED ORDER — PANTOPRAZOLE 2 MG/ML SUSPENSION
40.0000 mg | Freq: Every day | ORAL | Status: DC
  Administered 2021-09-17 – 2021-09-20 (×4): 40 mg
  Filled 2021-09-16 (×4): qty 20

## 2021-09-16 NOTE — Plan of Care (Addendum)
  Interdisciplinary Goals of Care Family Meeting   Date carried out:: 09/05/2021  Location of the meeting: Bedside  Member's involved: Bedside Registered Nurse, Family Member or next of kin, and Other: PA   Durable Power of Attorney or acting medical decision maker:   Kenneth Oneill, calls herself his wife, however listed as signficant other.    Discussion: We discussed goals of care for Kenneth Oneill .  Discussed that pt's early unresponsiveness and diffuse myoclonus is a very poor prognostic indicator and that I suspect patient will not survive this event.  She voices understanding, pt sister will arrive to the hospital later this morning and they will discuss transition to DNR at that time.  For now, continue full code  She gives permission for lines if needed  Code status: Full code  Disposition: Continue current acute care   Time spent for the meeting: 15 mintues  [name] Erman Thum 09/13/2021, 6:30 AM

## 2021-09-16 NOTE — Progress Notes (Signed)
LTM EEG hooked up and running - no initial skin breakdown - push button tested - neuro notified. [facility] monitoring.  

## 2021-09-16 NOTE — ED Triage Notes (Signed)
Patient presents to ed via [facility] states  per wife patient grabbed his head and collapsed.  States he was on the floor states his face was very pale and she called 911.  States she gave him Narcan because she had heard him talking on the phone earlier in the day about some Heroin. Upon ems arrival patient was apneic and pulseless,  0354 asystole CPR was started by ems , patient was given Epip x5  intubated#7.0 26 at lip per ems.  Return of ROSC at 0414. Upon arrival to ed patient was unresp. Heartrate was 150. Patient was posturing. . Dr. Nicanor Alcon at bedside. Upon  arrival.  0441 Bicarb. X 1 0447 Bicarb X1 0449 transported to CT with RN and resp.  0510 Fentanyl drip up to infuse at 77mcg/hr.  Fentanyl 50 mcg bolus given  0518 Fentanyl 100 mg bolus given  0525 Versed 2 mg IV 0534 Fentanyl 150 mg bolus 0545 Returned back to ED.  0550 CCM PA at bedside spoke with wife.  8088 Proprol drip 39mcg/hr. Per CCM Ativan 2 mg IV 1103 Neuro Dr. Iver Nestle at bedside.

## 2021-09-16 NOTE — H&P (Addendum)
NAME:  Kenneth Oneill, MRN:  [id number], DOB:  Feb 20, 1961, LOS: 0 ADMISSION DATE:  08/22/2021, CONSULTATION DATE:  08/28/2021 REFERRING MD:  EDP, CHIEF COMPLAINT:  unresponsive   History of Present Illness:  Kenneth Oneill is a 61 y.o. M with PMH significant for bipolar disorder, PTSD, ETOH abuse (wife reports sober for 9 months) possible cocaine/heroin use who fell from standing overnight.  His wife is at the bedside and states that she heard a thud, he was initially awake and holding his head, then slumped over and became unresponsive.  She does not think he had a pulse, but was unable to turn him on his back to start CPR.  It was approximately 9-10 minutes before EMS arrived, Pt was pulseless.  After ~20 minutes CPR ROSC achieved.   UDS positive for opiates and cannabinoids.  Pertinent  Medical History   has a past medical history of Arthritis, Bipolar disorder ([place]), Plantar fasciitis, bilateral, and PTSD (post-traumatic stress disorder).   Significant Hospital Events: Including procedures, antibiotic start and stop dates in addition to other pertinent events   5/26 Cardiac arrest, intubated, admit to PCCM  Interim History / Subjective:  As above  Objective   There were no vitals taken for this visit.       No intake or output data in the 24 hours ending 08/28/2021 0531 There were no vitals filed for this visit.  General:  critically ill-appearing M, intubated and sedated HEENT: MM pink/moist, pupils 103mm and unresponsive to light bilaterally  Neuro: diffuse, persistent upper and lower extremity myoclonus, seen on low dose Fentanyl, unresponsive to voice or pain, triggering breaths on vent CV: s1s2 rrr, no m/r/g PULM:  mechanical breath sounds bilaterally, equal chest rise GI: soft, non-distended Extremities: warm/dry, no edema  Skin: no rashes or lesions   Resolved Hospital Problem list     Assessment & Plan:   Unwitnessed out of hospital PEA cardiac arrest with subsequent  hypoxic respiratory failure  Likely severe anoxic brain injury 10 minutes down, 20 mins CPR -CT head read pending, but concern for severe anoxic injury in the setting of early myoclonus/seizing and prolonged down time -start propofol -normothermia protocol -echo -EEG --Maintain full vent support with SAT/SBT as tolerated -titrate Vent setting to maintain SpO2 greater than or equal to 90%. -HOB elevated 30 degrees. -Plateau pressures less than 30 cm H20.  -Follow chest x-ray, ABG prn.   -Bronchial hygiene and RT/bronchodilator protocol.     Myoclonus  -start propofol infusion, ativan 2mg  given -seen by Neurology and loaded with Keppra -EEG      Acute Kidney Injury Hypokalemia Creatinine 1.3, baseline appears around 1 -monitor renal indices and electrolytes -replete K and check magnesium level   History of ETOH and polysubstance abuse Wife thinks sober from ETOH for months, however level 86 here -suspect profound anoxic injury will preclude withdrawal issues, will give thiamine   Best Practice (right click and "Reselect all SmartList Selections" daily)   Diet/type: tubefeeds DVT prophylaxis: prophylactic heparin  GI prophylaxis: PPI Lines: N/A Foley:  Yes, and it is still needed Code Status:  full code Last date of multidisciplinary goals of care discussion [5/26, see separate IPAL note]  Labs   CBC: Recent Labs  Lab 09/19/2021 0455 08/30/2021 0458  WBC 13.0*  --   NEUTROABS PENDING  --   HGB 12.5* 12.6*  HCT 38.7* 37.0*  MCV 102.1*  --   PLT 157  --     Basic Metabolic Panel: Recent Labs  Lab 09/03/2021 0458  NA 144  K 3.0*  CL 106  GLUCOSE 180*  BUN 13  CREATININE 1.40*   GFR: CrCl cannot be calculated (Unknown ideal weight.). Recent Labs  Lab 09/19/2021 0455  WBC 13.0*    Liver Function Tests: No results for input(s): AST, ALT, ALKPHOS, BILITOT, PROT, ALBUMIN in the last 168 hours. No results for input(s): LIPASE, AMYLASE in the last 168  hours. No results for input(s): AMMONIA in the last 168 hours.  ABG    Component Value Date/Time   TCO2 20 (L) 08/24/2021 0458     Coagulation Profile: No results for input(s): INR, PROTIME in the last 168 hours.  Cardiac Enzymes: No results for input(s): CKTOTAL, CKMB, CKMBINDEX, TROPONINI in the last 168 hours.  HbA1C: No results found for: HGBA1C  CBG: Recent Labs  Lab 09/07/2021 0504  GLUCAP 154*    Review of Systems:   Unable to obtain  Past Medical History:  He,  has a past medical history of Arthritis, Bipolar disorder ([place]), Plantar fasciitis, bilateral, and PTSD (post-traumatic stress disorder).   Surgical History:   Past Surgical History:  Procedure Laterality Date   NERVE, TENDON AND ARTERY REPAIR Right 03/17/2014   Procedure: EXPLORE AND REPAIR RIGHT HAND;  Surgeon: Charlotte Crumb, MD;  Location: [facility];  Service: Orthopedics;  Laterality: Right;     Social History:   reports that he has been smoking cigarettes. He has been smoking an average of .5 packs per day. He has never used smokeless tobacco. He reports current alcohol use. He reports current drug use. Drug: Cocaine.   Family History:  His family history is not on file.   Allergies No Known Allergies   Home Medications  Prior to Admission medications   Medication Sig Start Date End Date Taking? Authorizing Provider  acetaminophen (TYLENOL) 500 MG tablet Take 1,000 mg by mouth every 6 (six) hours as needed for moderate pain or headache.   Yes [provider]  Artificial Tear Ointment (DRY EYES OP) Place 1 drop into both eyes 2 (two) times daily as needed (dry/itchy eyes).   Yes [provider]  Calcium Polycarbophil (FIBER) 625 MG TABS Take 2 tablets by mouth daily. 12/20/20  Yes [provider]  citalopram (CELEXA) 20 MG tablet Take 0.5 tablets (10 mg total) by mouth daily. Patient taking differently: Take 20 mg by mouth at bedtime. 06/17/18  Yes Patrecia Pour, NP   folic acid (FOLVITE) 1 MG tablet Take 1 mg by mouth daily. 12/22/20  Yes [provider]  gabapentin (NEURONTIN) 300 MG capsule Take 300 mg by mouth 3 (three) times daily as needed (pain). 12/22/20  Yes [provider]  Multiple Vitamins-Minerals (MULTIVITAMIN ADULTS 50+) TABS Take 1 tablet by mouth daily.   Yes [provider]  naloxone (NARCAN) nasal spray 4 mg/0.1 mL Place 1 spray into the nose daily as needed (opioid overdose). 01/27/21  Yes [provider]  Omega-3 Fatty Acids (FISH OIL) 1000 MG CAPS Take 1,000 mg by mouth daily.   Yes [provider]  potassium chloride SA (KLOR-CON) 20 MEQ tablet Take 20 mEq by mouth 2 (two) times daily with a meal. 01/27/21  Yes [provider]  QUEtiapine (SEROQUEL) 200 MG tablet Take 1 tablet (200 mg total) by mouth at bedtime. 06/16/18  Yes Patrecia Pour, NP  vitamin B-12 (CYANOCOBALAMIN) 100 MCG tablet Take 100 mcg by mouth daily.   Yes [provider]     Critical  care time:  45 minutes     CRITICAL CARE Performed by: Otilio Carpen Keyontae Huckeby   Total critical care time: 45 minutes  Critical care time was exclusive of separately billable procedures and treating other patients.  Critical care was necessary to treat or prevent imminent or life-threatening deterioration.  Critical care was time spent personally by me on the following activities: development of treatment plan with patient and/or surrogate as well as nursing, discussions with consultants, evaluation of patient's response to treatment, examination of patient, obtaining history from patient or surrogate, ordering and performing treatments and interventions, ordering and review of laboratory studies, ordering and review of radiographic studies, pulse oximetry and re-evaluation of patient's condition.   Otilio Carpen Baudelia Schroepfer, PA-C [facility] See Amion for pager If no response to pager , please call [phone] until  7pm After 7:00 pm call Elink  [phone]?[place]

## 2021-09-16 NOTE — Procedures (Addendum)
Patient Name: Kenneth Oneill  MRN: [id number]  Epilepsy Attending: Charlsie Quest  Referring Physician/Provider: Gordy Councilman, MD Date: October 03, 2021 Duration: 23.45 mins  Patient history:60yo M s/p cardiac arrest with exam highly concerning for potential myoclonic seizures . EEG to evaluate for seizure.  Level of alertness:  comatose  AEDs during EEG study: LEV, propofol  Technical aspects: This EEG study was done with scalp electrodes positioned according to the 10-20 International system of electrode placement. Electrical activity was acquired at a sampling rate of 500Hz  and reviewed with a high frequency filter of 70Hz  and a low frequency filter of 1Hz . EEG data were recorded continuously and digitally stored.   Description: Patient was noted to have episodes of whole body brief jerk along with bilateral spontaneous eye opening. Concomitant eeg showed generalized polyspikes consistent with myoclonic seizures. In between seizures, EEG showed continuous generalized background suppression. Hyperventilation and photic stimulation were not performed.     ABNORMALITY -Myoclonic seizure, generalized -Background suppression, generalized    IMPRESSION: This study showed myoclonic seizures every few seconds as well as profound diffuse encephalopathy. 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  Dr and Dr were notified.  Dolorez Jeffrey 

## 2021-09-16 NOTE — Progress Notes (Signed)
EEG completed, results pending. 

## 2021-09-16 NOTE — Progress Notes (Signed)
Received from ED on stretcher, monitored transport. Bedside updates received from [facility]. VSS. Exhibiting frequent myoclonic seizure activity. On fentanyl and low dose propofol. Opens eyes during seizures but otherwise unresponsive. See flowsheet for full assessment. Continuing to monitor.

## 2021-09-16 NOTE — ED Notes (Signed)
Patient was incont of very loose watery stool cleaned patient

## 2021-09-16 NOTE — Progress Notes (Signed)
61 year old male with polysubstance abuse was admitted after he had PEA cardiac arrest with 10 minutes downtime followed by 20 minutes of CPR  He was admitted overnight, remained intubated, having visible myoclonus despite being on propofol infusion and loaded with Keppra and now getting Keppra twice daily  CT scan showed motion abnormalities, no gross abnormalities  Continuous EEG was applied which is consistent with myoclonic seizures Neurology is following  Patient does have a weak cough and gag and he is triggering ventilator, no motor response to painful stimuli  Spoke with patient's family at length, they would like to continue full scope of care We will do MRI brain tomorrow    Total critical care time: 31 minutes  Performed by: Jacky Kindle   Critical care time was exclusive of separately billable procedures and treating other patients.   Critical care was necessary to treat or prevent imminent or life-threatening deterioration.   Critical care was time spent personally by me on the following activities: development of treatment plan with patient and/or surrogate as well as nursing, discussions with consultants, evaluation of patient's response to treatment, examination of patient, obtaining history from patient or surrogate, ordering and performing treatments and interventions, ordering and review of laboratory studies, ordering and review of radiographic studies, pulse oximetry and re-evaluation of patient's condition.   Jacky Kindle MD [facility] See Amion for pager If no response to pager, please call [phone] until 7pm After 7pm, Please call E-link [phone]

## 2021-09-16 NOTE — Progress Notes (Signed)
Patient transported from ED to 2H09 without complication.

## 2021-09-16 NOTE — Consult Note (Signed)
Neurology Consultation Reason for Consult: Abnormal movements Requesting Physician: Briant Sites   CC: Cardiac arrest   History is obtained from: Wife at bedside and EDP and chart review  HPI: Kenneth Oneill is a 61 y.o. male with past medical history of PTSD/mood disorder, alcohol abuse (sober for 9 months per partner), cocaine use, tobacco use, who presents after cardiac arrest.  Wife reports she heard him talking to someone today and thinks he was using heroin.  She then heard him fall and when she came into the room to look at him he was laying on the floor clutching his head.  He was not responsive, she attempted giving him Narcan but there was no change in his status and she saw that he did not have a pulse.  There was at least 10 minutes of downtime prior to CPR being initiated, and initial rhythm on EMS arrival was asystole, with 20 minutes of CPR before ROSC was achieved.  On arrival to the ED he was in atrial fibrillation, but at the time of my evaluation he is in sinus   ROS: Unable to obtain due to altered mental status.   Past Medical History:  Diagnosis Date   Arthritis    Bipolar disorder ([facility])    Plantar fasciitis, bilateral    PTSD (post-traumatic stress disorder)    Past Surgical History:  Procedure Laterality Date   NERVE, TENDON AND ARTERY REPAIR Right 03/17/2014   Procedure: EXPLORE AND REPAIR RIGHT HAND;  Surgeon: Dairl Ponder, MD;  Location: [facility];  Service: Orthopedics;  Laterality: Right;   Current Outpatient Medications  Medication Instructions   acetaminophen (TYLENOL) 1,000 mg, Oral, Every 6 hours PRN   Artificial Tear Ointment (DRY EYES OP) 1 drop, Both Eyes, 2 times daily PRN   Calcium Polycarbophil (FIBER) 625 MG TABS 2 tablets, Oral, Daily   citalopram (CELEXA) 10 mg, Oral, Daily   Fish Oil 1,000 mg, Oral, Daily   folic acid (FOLVITE) 1 mg, Oral, Daily   gabapentin (NEURONTIN) 300 mg, Oral, 3 times daily PRN   Multiple Vitamins-Minerals  (MULTIVITAMIN ADULTS 50+) TABS 1 tablet, Oral, Daily   naloxone (NARCAN) nasal spray 4 mg/0.1 mL 1 spray, Nasal, Daily PRN   potassium chloride SA (KLOR-CON) 20 MEQ tablet 20 mEq, Oral, 2 times daily with meals   QUEtiapine (SEROQUEL) 200 mg, Oral, Daily at bedtime   vitamin B-12 (CYANOCOBALAMIN) 100 mcg, Oral, Daily     History reviewed. No pertinent family history.   Social History:  reports that he has been smoking cigarettes. He has been smoking an average of .5 packs per day. He has never used smokeless tobacco. He reports current alcohol use. He reports current drug use. Drug: Cocaine.   Exam: Current vital signs: Ht 5\' 8"  (1.727 m)   Wt 83.5 kg   SpO2 100%   BMI 27.98 kg/m  Vital signs in last 24 hours: SpO2:  [100 %] 100 % (05/26 0549) FiO2 (%):  [100 %] 100 % (05/26 0549) Weight:  [83.5 kg] 83.5 kg (05/26 0600)   Physical Exam  Constitutional: Appears well-developed and well-nourished.  Psych: Minimally interactive Eyes: Scleral edema is absent  HENT: ET tube in place MSK: no joint deformities.  Cardiovascular: Normal rate and regular rhythm.  Respiratory: Breathing at ventilator set rate  GI: Soft.  No distension. There is no obvious tenderness.  Skin: Warm dry and intact visible skin.  There is no significant edema   Neuro: Mental Status: Does not open eyes  spontaneously, to voice or noxious stimulation Does not follow any commands Cranial Nerves: II: No blink to threat. Left pupil minimally reactive 2 -> 1.75 mm, right pupil 2 mm and fixed  III,IV, VI/VIII: EOMI to VOR absent V/VII: Left corneal absent, right present only to gauze stimulation VIII: No response to voice X/XI: Intact cough, no clear gag XII: Unable to assess tongue protrusion secondary to patient's mental status  Motor/Sensory: Tone is normal. Bulk is normal. Intermittent full body jerking concerning for myoclonic seizures Deep Tendon Reflexes: Absent in the patellar's, biceps and  brachioradialis 2+ and symmetric Plantars: Toes are mute bilaterally.  Cerebellar: Unable to assess secondary to patient's mental status    I have reviewed labs in epic and the results pertinent to this consultation are:  Basic Metabolic Panel: Recent Labs  Lab 09/01/2021 0455 09/03/2021 0458  NA 142 144  K 3.1* 3.0*  CL 108 106  CO2 15*  --   GLUCOSE 183* 180*  BUN 12 13  CREATININE 1.37* 1.40*  CALCIUM 7.0*  --     CBC: Recent Labs  Lab 09/10/2021 0455 08/29/2021 0458  WBC 13.0*  --   NEUTROABS 7.8*  --   HGB 12.5* 12.6*  HCT 38.7* 37.0*  MCV 102.1*  --   PLT 157  --     Coagulation Studies: No results for input(s): LABPROT, INR in the last 72 hours.    I have reviewed the images obtained:  Head CT and CT C-spine personally reviewed, agree with radiology: IMPRESSION: 1. Markedly motion degraded study of the brain despite repeat scanning. No gross acute intracranial abnormality. Subtle subarachnoid hemorrhage or areas of acute ischemia could be obscured by the motion degradation. 2. No evidence for cervical spine fracture or subluxation.  CT chest/abd/pel for dissection 1. Very motion degraded study. 2. No signs of acute aortic syndrome. 3. Possible minimal anterior rib cortex fractures. No displaced fracture. 4. Increased fluid content within bowel. No bowel wall thickening or obstructive pattern. 5. Atherosclerosis.  Impression: Exam highly concerning for potential myoclonic seizures especially given the history.  Examination consistent with partial brainstem compromise.  Goals of care discussions are ongoing between primary team and family, full code at this time  Recommendations: -4000 mg Keppra loading dose followed by 1000 mg twice daily Estimated Creatinine Clearance: 54.2 mL/min (A) (by C-G formula based on SCr of 1.4 mg/dL (H)).   CrCl 80 to 130 mL/minute/1.73 m2: 500 mg to 1.5 g every 12 hours.  CrCl 50 to <80 mL/minute/1.73 m2: 500 mg to 1 g every  12 hours.  CrCl 30 to <50 mL/minute/1.73 m2: 250 to 750 mg every 12 hours.  CrCl 15 to <30 mL/minute/1.73 m2: 250 to 500 mg every 12 hours.  CrCl <15 mL/minute/1.73 m2: 250 to 500 mg every 24 hours (expert opinion). -Stat EEG, followed by continuous EEG -Repeat head CT when stabilized -Neurology will follow  Brooke Dare MD-PhD [facility] [phone]  CRITICAL CARE Performed by: Gordy Councilman   Total critical care time: 40 minutes  Critical care time was exclusive of separately billable procedures and treating other patients.  Critical care was necessary to treat or prevent imminent or life-threatening deterioration.  Critical care was time spent personally by me on the following activities: development of treatment plan with patient and/or surrogate as well as nursing, discussions with consultants, evaluation of patient's response to treatment, examination of patient, obtaining history from patient or surrogate, ordering and performing treatments and interventions, ordering and review  of laboratory studies, ordering and review of radiographic studies, pulse oximetry and re-evaluation of patient's condition.

## 2021-09-16 NOTE — ED Provider Notes (Signed)
[place] EMERGENCY DEPARTMENT Provider Note   CSN: [id number] Arrival date & time: 08/24/2021  0440     History  No chief complaint on file.   Kenneth Oneill is a 61 y.o. male.  The history is provided by the EMS personnel. The history is limited by the condition of the patient.  Cardiac Arrest Witnessed by:  Not witnessed Incident location:  Home Time before BLS initiated:  > 5 minutes Time before ALS initiated:  5-8 minutes Condition upon EMS arrival:  Apneic Pulse:  Absent Initial cardiac rhythm per EMS:  Asystole Treatments prior to arrival:  ACLS protocol, vascular access and intubation Medications given prior to ED:  Epinephrine IV access type:  Intraosseous and peripheral Airway:  Intubation prior to arrival Rhythm on admission to ED:  Atrial fibrillation Associated symptoms comment:  Grabbed head and went to ground.  May have ingested heroin  Risk factors: no trauma       Home Medications Prior to Admission medications   Medication Sig Start Date End Date Taking? Authorizing Provider  acetaminophen (TYLENOL) 500 MG tablet Take 1,000 mg by mouth every 6 (six) hours as needed for moderate pain or headache.   Yes [provider]  Artificial Tear Ointment (DRY EYES OP) Place 1 drop into both eyes 2 (two) times daily as needed (dry/itchy eyes).   Yes [provider]  Calcium Polycarbophil (FIBER) 625 MG TABS Take 2 tablets by mouth daily. 12/20/20  Yes [provider]  citalopram (CELEXA) 20 MG tablet Take 0.5 tablets (10 mg total) by mouth daily. Patient taking differently: Take 20 mg by mouth at bedtime. 06/17/18  Yes Patrecia Pour, NP  folic acid (FOLVITE) 1 MG tablet Take 1 mg by mouth daily. 12/22/20  Yes [provider]  gabapentin (NEURONTIN) 300 MG capsule Take 300 mg by mouth 3 (three) times daily as needed (pain). 12/22/20  Yes [provider]  Multiple Vitamins-Minerals (MULTIVITAMIN ADULTS 50+)  TABS Take 1 tablet by mouth daily.   Yes [provider]  naloxone (NARCAN) nasal spray 4 mg/0.1 mL Place 1 spray into the nose daily as needed (opioid overdose). 01/27/21  Yes [provider]  Omega-3 Fatty Acids (FISH OIL) 1000 MG CAPS Take 1,000 mg by mouth daily.   Yes [provider]  potassium chloride SA (KLOR-CON) 20 MEQ tablet Take 20 mEq by mouth 2 (two) times daily with a meal. 01/27/21  Yes [provider]  QUEtiapine (SEROQUEL) 200 MG tablet Take 1 tablet (200 mg total) by mouth at bedtime. 06/16/18  Yes Patrecia Pour, NP  vitamin B-12 (CYANOCOBALAMIN) 100 MCG tablet Take 100 mcg by mouth daily.   Yes [provider]      Allergies    Patient has no known allergies.    Review of Systems   Review of Systems  Unable to perform ROS: Acuity of condition   Physical Exam Updated Vital Signs There were no vitals taken for this visit. Physical Exam Vitals and nursing note reviewed. Exam conducted with a chaperone present.  Constitutional:      Comments: Intubated not purposeful movements   HENT:     Head: Normocephalic and atraumatic.     Right Ear: Tympanic membrane normal.     Left Ear: Tympanic membrane normal.     Nose: Nose normal.     Mouth/Throat:     Mouth: Mucous membranes are moist.  Eyes:     Conjunctiva/sclera: Conjunctivae normal.  Cardiovascular:  Rate and Rhythm: Regular rhythm. Tachycardia present.     Pulses: Normal pulses.     Heart sounds: Normal heart sounds.  Pulmonary:     Breath sounds: Rhonchi present.  Abdominal:     Tenderness: There is no rebound.     Hernia: No hernia is present.  Musculoskeletal:     Cervical back: No rigidity.     Right lower leg: No edema.     Left lower leg: No edema.  Lymphadenopathy:     Cervical: No cervical adenopathy.  Skin:    General: Skin is warm and dry.     Capillary Refill: Capillary refill takes 2 to 3 seconds.  Neurological:     GCS: GCS eye subscore is  1. GCS verbal subscore is 1. GCS motor subscore is 1.    ED Results / Procedures / Treatments   Labs (all labs ordered are listed, but only abnormal results are displayed) Results for orders placed or performed during the hospital encounter of 09/19/2021  SARS Coronavirus 2 by RT PCR (hospital order, performed in [facility] hospital lab) *cepheid single result test* Anterior Nasal Swab   Specimen: Anterior Nasal Swab  Result Value Ref Range   SARS Coronavirus 2 by RT PCR NEGATIVE NEGATIVE  Comprehensive metabolic panel  Result Value Ref Range   Sodium 142 135 - 145 mmol/L   Potassium 3.1 (L) 3.5 - 5.1 mmol/L   Chloride 108 98 - 111 mmol/L   CO2 15 (L) 22 - 32 mmol/L   Glucose, Bld 183 (H) 70 - 99 mg/dL   BUN 12 8 - 23 mg/dL   Creatinine, Ser 1.37 (H) 0.61 - 1.24 mg/dL   Calcium 7.0 (L) 8.9 - 10.3 mg/dL   Total Protein 4.7 (L) 6.5 - 8.1 g/dL   Albumin 2.5 (L) 3.5 - 5.0 g/dL   AST 88 (H) 15 - 41 U/L   ALT 49 (H) 0 - 44 U/L   Alkaline Phosphatase 69 38 - 126 U/L   Total Bilirubin 0.2 (L) 0.3 - 1.2 mg/dL   GFR, Estimated 58 (L) >60 mL/min   Anion gap 19 (H) 5 - 15  Urine rapid drug screen (hosp performed)  Result Value Ref Range   Opiates POSITIVE (A) NONE DETECTED   Cocaine NONE DETECTED NONE DETECTED   Benzodiazepines NONE DETECTED NONE DETECTED   Amphetamines NONE DETECTED NONE DETECTED   Tetrahydrocannabinol POSITIVE (A) NONE DETECTED   Barbiturates NONE DETECTED NONE DETECTED  CBC WITH DIFFERENTIAL  Result Value Ref Range   WBC 13.0 (H) 4.0 - 10.5 K/uL   RBC 3.79 (L) 4.22 - 5.81 MIL/uL   Hemoglobin 12.5 (L) 13.0 - 17.0 g/dL   HCT 38.7 (L) 39.0 - 52.0 %   MCV 102.1 (H) 80.0 - 100.0 fL   MCH 33.0 26.0 - 34.0 pg   MCHC 32.3 30.0 - 36.0 g/dL   RDW 13.3 11.5 - 15.5 %   Platelets 157 150 - 400 K/uL   nRBC 0.3 (H) 0.0 - 0.2 %   Neutrophils Relative % 60 %   Neutro Abs 7.8 (H) 1.7 - 7.7 K/uL   Lymphocytes Relative 32 %   Lymphs Abs 4.2 (H) 0.7 - 4.0 K/uL   Monocytes  Relative 5 %   Monocytes Absolute 0.7 0.1 - 1.0 K/uL   Eosinophils Relative 0 %   Eosinophils Absolute 0.0 0.0 - 0.5 K/uL   Basophils Relative 0 %   Basophils Absolute 0.0 0.0 - 0.1 K/uL   nRBC  1 (H) 0 /100 WBC   Metamyelocytes Relative 3 %   Abs Immature Granulocytes 0.40 (H) 0.00 - 0.07 K/uL  CBG monitoring, ED  Result Value Ref Range   Glucose-Capillary 154 (H) 70 - 99 mg/dL  I-Stat Chem 8, ED  Result Value Ref Range   Sodium 144 135 - 145 mmol/L   Potassium 3.0 (L) 3.5 - 5.1 mmol/L   Chloride 106 98 - 111 mmol/L   BUN 13 8 - 23 mg/dL   Creatinine, Ser 1.40 (H) 0.61 - 1.24 mg/dL   Glucose, Bld 180 (H) 70 - 99 mg/dL   Calcium, Ion 0.97 (L) 1.15 - 1.40 mmol/L   TCO2 20 (L) 22 - 32 mmol/L   Hemoglobin 12.6 (L) 13.0 - 17.0 g/dL   HCT 37.0 (L) 39.0 - 52.0 %   DG Chest Portable 1 View  Result Date: 09/05/2021 CLINICAL DATA:  Status post CPR. EXAM: PORTABLE CHEST 1 VIEW COMPARISON:  11/21/2019 FINDINGS: 0450 hours. If tracheal tube tip is approximately 7.5 cm above the base of the carina. Low volume film. The cardio pericardial silhouette is enlarged. Vascular congestion evident. Diffuse interstitial opacity suggests edema. No pneumothorax or evidence of pleural effusion. Telemetry leads overlie the chest. IMPRESSION: Low volume film with vascular congestion and probable interstitial edema. Electronically Signed   By: Misty Stanley M.D.   On: 08/23/2021 05:00     EKG  EKG Interpretation  Date/Time:  Friday Sep 16 2021 04:49:55 EDT Ventricular Rate:  100 PR Interval:  161 QRS Duration: 108 QT Interval:  410 QTC Calculation: 529 R Axis:   -78 Text Interpretation: Sinus tachycardia LAD, consider left anterior fascicular block Prolonged QT interval Confirmed by Dory Horn) on 08/29/2021 6:07:58 AM        Radiology DG Chest Portable 1 View  Result Date: 09/15/2021 CLINICAL DATA:  Status post CPR. EXAM: PORTABLE CHEST 1 VIEW COMPARISON:  11/21/2019 FINDINGS: 0450  hours. If tracheal tube tip is approximately 7.5 cm above the base of the carina. Low volume film. The cardio pericardial silhouette is enlarged. Vascular congestion evident. Diffuse interstitial opacity suggests edema. No pneumothorax or evidence of pleural effusion. Telemetry leads overlie the chest. IMPRESSION: Low volume film with vascular congestion and probable interstitial edema. Electronically Signed   By: Misty Stanley M.D.   On: 09/09/2021 05:00    Procedures Procedures    Medications Ordered in ED Medications  fentaNYL 2548mcg in NS 252mL (63mcg/ml) infusion-PREMIX (50 mcg/hr Intravenous New Bag/Given 08/24/2021 0514)  midazolam (VERSED) 2 MG/2ML injection (has no administration in time range)  docusate (COLACE) 50 MG/5ML liquid 100 mg (has no administration in time range)  polyethylene glycol (MIRALAX / GLYCOLAX) packet 17 g (has no administration in time range)  propofol (DIPRIVAN) 1000 MG/100ML infusion (has no administration in time range)  LORazepam (ATIVAN) injection 2 mg (has no administration in time range)  propofol (DIPRIVAN) 1000 MG/100ML infusion (has no administration in time range)  iohexol (OMNIPAQUE) 350 MG/ML injection 100 mL (100 mLs Intravenous Contrast Given 09/01/2021 0549)    ED Course/ Medical Decision Making/ A&P                           Medical Decision Making According to wife, heard a noise, found patient down and clutching head she gave narcan.  No bystander CPR  Amount and/or Complexity of Data Reviewed Independent Historian: spouse and EMS    Details: see above External Data Reviewed: notes.  Details: previous notes reviewed. Labs: ordered.    Details: all labs reviewed: uds positive for THC and opiates-- no opiates given prior to urine being obtained.  Elevated AST and ALT on liver panel creatinine slight elevated at 1.37. White count slightly elevated at 13.  Hemoglobin 12.5.  negative covid test Radiology: ordered.    Details: No skull or C  spine fracture on CT, ETT in good position on CXR all by me Discussion of management or test interpretation with external provider(s): Dr. Ruthann Cancer of PCCM who will admit the patient  Dr. Curly Shores of neuro who will see the patient   Risk Prescription drug management. Parenteral controlled substances. Decision regarding hospitalization.  Critical Care Total time providing critical care: 60 minutes (Fentanyl drip for sedation and calls to consultants and time at bedside with patient )   Final Clinical Impression(s) / ED Diagnoses Final diagnoses:  Cardiac arrest [facility])    The patient appears reasonably stabilized for admission considering the current resources, flow, and capabilities available in the ED at this time, and I doubt any other [facility] requiring further screening and/or treatment in the ED prior to admission.    Jewelle Whitner, MD 09/03/2021 [phone]

## 2021-09-17 DIAGNOSIS — I469 Cardiac arrest, cause unspecified: Secondary | ICD-10-CM | POA: Diagnosis not present

## 2021-09-17 DIAGNOSIS — G40409 Other generalized epilepsy and epileptic syndromes, not intractable, without status epilepticus: Secondary | ICD-10-CM | POA: Diagnosis not present

## 2021-09-17 LAB — CBC
HCT: 40.4 % (ref 39.0–52.0)
Hemoglobin: 12.6 g/dL — ABNORMAL LOW (ref 13.0–17.0)
MCH: 31.8 pg (ref 26.0–34.0)
MCHC: 31.2 g/dL (ref 30.0–36.0)
MCV: 102 fL — ABNORMAL HIGH (ref 80.0–100.0)
Platelets: 120 10*3/uL — ABNORMAL LOW (ref 150–400)
RBC: 3.96 MIL/uL — ABNORMAL LOW (ref 4.22–5.81)
RDW: 13.9 % (ref 11.5–15.5)
WBC: 13.9 10*3/uL — ABNORMAL HIGH (ref 4.0–10.5)
nRBC: 0 % (ref 0.0–0.2)

## 2021-09-17 LAB — BASIC METABOLIC PANEL
Anion gap: 11 (ref 5–15)
BUN: 18 mg/dL (ref 8–23)
CO2: 22 mmol/L (ref 22–32)
Calcium: 7.5 mg/dL — ABNORMAL LOW (ref 8.9–10.3)
Chloride: 108 mmol/L (ref 98–111)
Creatinine, Ser: 1.42 mg/dL — ABNORMAL HIGH (ref 0.61–1.24)
GFR, Estimated: 56 mL/min — ABNORMAL LOW (ref >60)
Glucose, Bld: 71 mg/dL (ref 70–99)
Potassium: 3.8 mmol/L (ref 3.5–5.1)
Sodium: 141 mmol/L (ref 135–145)

## 2021-09-17 LAB — GLUCOSE, CAPILLARY
Glucose-Capillary: 131 mg/dL — ABNORMAL HIGH (ref 70–99)
Glucose-Capillary: 136 mg/dL — ABNORMAL HIGH (ref 70–99)
Glucose-Capillary: 64 mg/dL — ABNORMAL LOW (ref 70–99)
Glucose-Capillary: 71 mg/dL (ref 70–99)
Glucose-Capillary: 76 mg/dL (ref 70–99)
Glucose-Capillary: 76 mg/dL (ref 70–99)
Glucose-Capillary: 77 mg/dL (ref 70–99)
Glucose-Capillary: 78 mg/dL (ref 70–99)

## 2021-09-17 LAB — PHOSPHORUS: Phosphorus: 4.3 mg/dL (ref 2.5–4.6)

## 2021-09-17 LAB — MAGNESIUM: Magnesium: 1.8 mg/dL (ref 1.7–2.4)

## 2021-09-17 LAB — TRIGLYCERIDES: Triglycerides: 230 mg/dL — ABNORMAL HIGH (ref <150)

## 2021-09-17 MED ORDER — DEXTROSE 50 % IV SOLN
INTRAVENOUS | Status: AC
  Administered 2021-09-17: 50 mL via INTRAVENOUS
  Filled 2021-09-17: qty 50

## 2021-09-17 MED ORDER — DEXTROSE 50 % IV SOLN: 25.0000 mL | Freq: Once | INTRAVENOUS | Status: AC

## 2021-09-17 MED ORDER — CHLORHEXIDINE GLUCONATE 0.12% ORAL RINSE (MEDLINE KIT)
15.0000 mL | Freq: Two times a day (BID) | OROMUCOSAL | Status: DC
  Administered 2021-09-17 – 2021-09-20 (×7): 15 mL via OROMUCOSAL

## 2021-09-17 MED ORDER — POTASSIUM CHLORIDE 20 MEQ PO PACK
40.0000 meq | PACK | Freq: Once | ORAL | Status: AC
Start: 2021-09-17 — End: 2021-09-17
  Administered 2021-09-17: 40 meq
  Filled 2021-09-17: qty 2

## 2021-09-17 MED ORDER — MIDAZOLAM-SODIUM CHLORIDE 100-0.9 MG/100ML-% IV SOLN
0.5000 mg/h | INTRAVENOUS | Status: DC
  Administered 2021-09-17: 0.5 mg/h via INTRAVENOUS
  Administered 2021-09-18: 3 mg/h via INTRAVENOUS
  Administered 2021-09-19: 5 mg/h via INTRAVENOUS
  Filled 2021-09-17 (×3): qty 100

## 2021-09-17 MED ORDER — MAGNESIUM SULFATE 2 GM/50ML IV SOLN
2.0000 g | Freq: Once | INTRAVENOUS | Status: AC
  Administered 2021-09-17: 2 g via INTRAVENOUS
  Filled 2021-09-17: qty 50

## 2021-09-17 MED ORDER — DEXTROSE IN LACTATED RINGERS 5 % IV SOLN: INTRAVENOUS | Status: DC

## 2021-09-17 MED ORDER — ORAL CARE MOUTH RINSE
15.0000 mL | OROMUCOSAL | Status: DC
  Administered 2021-09-17 – 2021-09-20 (×36): 15 mL via OROMUCOSAL

## 2021-09-17 NOTE — Progress Notes (Signed)
Pharmacy Antibiotic Note  Kenneth Oneill is a 61 y.o. male admitted on 09/18/2021 with  aspiration PNA .  Pharmacy has been consulted for Unasyn dosing.  Plan: Unasyn  3g q 6 hrs. F/u LOT.  Height: 5\' 6"  (167.6 cm) Weight: 82.8 kg (182 lb 8.7 oz) IBW/kg (Calculated) : 63.8  Temp (24hrs), Avg:99 F (37.2 C), Min:97.7 F (36.5 C), Max:101.1 F (38.4 C)  Recent Labs  Lab 09/08/2021 0455 08/27/2021 0458 08/24/2021 0701 08/24/2021 0714 09/07/2021 2055 09/17/21 0616  WBC 13.0*  --  10.8*  --  14.6* 13.9*  CREATININE 1.37* 1.40*  --  1.47* 1.50* 1.42*  LATICACIDVEN >9.0*  --   --  5.1* 1.3  --     Estimated Creatinine Clearance: 54.5 mL/min (A) (by C-G formula based on SCr of 1.42 mg/dL (H)).    No Known Allergies   Thank you for allowing pharmacy to be a part of this patient's care.  [place] Crane, Roylene Reason, [facility] Clinical Pharmacist  09/17/2021 8:41 AM   [facility] pharmacy phone numbers are listed on [email or web address]

## 2021-09-17 NOTE — Progress Notes (Signed)
   NAME:  Kenneth Oneill, MRN:  [id number], DOB:  09/05/1960, LOS: 1 ADMISSION DATE:  09-17-2021, CONSULTATION DATE:  09/17/21 REFERRING MD:  EDP, CHIEF COMPLAINT:  unresponsive   History of Present Illness:  Kenneth Oneill is a 61 y.o. M with PMH significant for bipolar disorder, PTSD, ETOH abuse (wife reports sober for 9 months) possible cocaine/heroin use who fell from standing overnight.  His wife is at the bedside and states that she heard a thud, he was initially awake and holding his head, then slumped over and became unresponsive.  She does not think he had a pulse, but was unable to turn him on his back to start CPR.  It was approximately 9-10 minutes before EMS arrived, Pt was pulseless.  After ~20 minutes CPR ROSC achieved.   UDS positive for opiates and cannabinoids.  Pertinent  Medical History   has a past medical history of Arthritis, Bipolar disorder (HCC), Plantar fasciitis, bilateral, and PTSD (post-traumatic stress disorder).   Significant Hospital Events: Including procedures, antibiotic start and stop dates in addition to other pertinent events   5/26 Cardiac arrest, intubated, admit to PCCM  Interim History / Subjective:  Continues to seize.  Objective   Blood pressure 109/72, pulse 78, temperature 98.6 F (37 C), resp. rate (!) 22, height 5\' 6"  (1.676 m), weight 82.8 kg, SpO2 100 %.    Vent Mode: PRVC FiO2 (%):  [40 %] 40 % Set Rate:  [22 bmp] 22 bmp Vt Set:  [540 mL] 540 mL PEEP:  [5 cmH20] 5 cmH20 Plateau Pressure:  [13 cmH20-20 cmH20] 16 cmH20   Intake/Output Summary (Last 24 hours) at 09/17/2021 09/19/2021 Last data filed at 09/17/2021 0700 Gross per 24 hour  Intake 3515.41 ml  Output 2320 ml  Net 1195.41 ml   Filed Weights   09/17/2021 0600 Sep 17, 2021 0617 09/17/21 0400  Weight: 83.5 kg 79.4 kg 82.8 kg    Jerking in bed Pupils small reactive Cough/gag intact Some withdrawal to pain Lungs with rhonci Ext warm  Echo okay Labs okay/stable  Resolved  Hospital Problem list     Assessment & Plan:   Unwitnessed out of hospital PEA cardiac arrest with subsequent hypoxic respiratory failure - 10 minutes down, 20 mins CPR.   Seems most c/w sequelae of overdose. 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/ resp failure- with question of aspiration  - Continue TTM - AEDs per neuro, I am probably going to start a versed drip if only to lessen stress for family seeing him in persistent myoclonus - Will reach out to neuro to discuss timing of additional imaging - Continue vent support/VAP prevention bundle - Continue thiamine/folate - Grim prognosis  Best Practice (right click and "Reselect all SmartList Selections" daily)   Diet/type: tubefeeds DVT prophylaxis: prophylactic heparin  GI prophylaxis: PPI Lines: N/A Foley:  Yes, and it is still needed Code Status:  full code Last date of multidisciplinary goals of care discussion [5/26, see separate IPAL note]  33 min cc time 06-03-1992 MD PCCM

## 2021-09-17 NOTE — Procedures (Signed)
Patient Name: Kenneth Oneill  MRN: [id number]  Epilepsy Attending: Lora Havens  Referring Physician/Provider: Lorenza Chick, MD Duration: 09/19/2021 1022 to 09/17/2021 1022   Patient history:60yo M s/p cardiac arrest with exam highly concerning for potential myoclonic seizures . EEG to evaluate for seizure.   Level of alertness:  comatose   AEDs during EEG study: LEV, propofol   Technical aspects: This EEG study was done with scalp electrodes positioned according to the 10-20 International system of electrode placement. Electrical activity was acquired at a sampling rate of 500Hz  and reviewed with a high frequency filter of 70Hz  and a low frequency filter of 1Hz . EEG data were recorded continuously and digitally stored.    Description: At the beginning of the study, patient was noted to have episodes of whole body brief jerk along with bilateral spontaneous eye opening. Concomitant eeg showed generalized polyspikes consistent with myoclonic seizures. In between seizures, EEG showed continuous generalized background suppression.   Gradually, the semiology of myoclonic seizures changed and only showed eye opening with facial twitching every 1-3 seconds. In between seizures, EEG showed continuous generalized 3-5hz  theta-delta slowing.  Hyperventilation and photic stimulation were not performed.      ABNORMALITY -Myoclonic seizure, generalized -Continuous slow, generalized     IMPRESSION: This study showed myoclonic seizures every few seconds as well as severe diffuse encephalopathy. 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   Tai Skelly Barbra Sarks

## 2021-09-18 DIAGNOSIS — G40409 Other generalized epilepsy and epileptic syndromes, not intractable, without status epilepticus: Secondary | ICD-10-CM | POA: Diagnosis not present

## 2021-09-18 DIAGNOSIS — I469 Cardiac arrest, cause unspecified: Secondary | ICD-10-CM | POA: Diagnosis not present

## 2021-09-18 LAB — CBC
HCT: 37.5 % — ABNORMAL LOW (ref 39.0–52.0)
Hemoglobin: 12.2 g/dL — ABNORMAL LOW (ref 13.0–17.0)
MCH: 32.9 pg (ref 26.0–34.0)
MCHC: 32.5 g/dL (ref 30.0–36.0)
MCV: 101.1 fL — ABNORMAL HIGH (ref 80.0–100.0)
Platelets: 113 10*3/uL — ABNORMAL LOW (ref 150–400)
RBC: 3.71 MIL/uL — ABNORMAL LOW (ref 4.22–5.81)
RDW: 14.2 % (ref 11.5–15.5)
WBC: 10.9 10*3/uL — ABNORMAL HIGH (ref 4.0–10.5)
nRBC: 0 % (ref 0.0–0.2)

## 2021-09-18 LAB — MAGNESIUM: Magnesium: 2.1 mg/dL (ref 1.7–2.4)

## 2021-09-18 LAB — BASIC METABOLIC PANEL
Anion gap: 6 (ref 5–15)
BUN: 16 mg/dL (ref 8–23)
CO2: 21 mmol/L — ABNORMAL LOW (ref 22–32)
Calcium: 8 mg/dL — ABNORMAL LOW (ref 8.9–10.3)
Chloride: 116 mmol/L — ABNORMAL HIGH (ref 98–111)
Creatinine, Ser: 1.11 mg/dL (ref 0.61–1.24)
GFR, Estimated: >60 mL/min (ref >60)
Glucose, Bld: 195 mg/dL — ABNORMAL HIGH (ref 70–99)
Potassium: 3.5 mmol/L (ref 3.5–5.1)
Sodium: 143 mmol/L (ref 135–145)

## 2021-09-18 LAB — GLUCOSE, CAPILLARY
Glucose-Capillary: 123 mg/dL — ABNORMAL HIGH (ref 70–99)
Glucose-Capillary: 127 mg/dL — ABNORMAL HIGH (ref 70–99)
Glucose-Capillary: 129 mg/dL — ABNORMAL HIGH (ref 70–99)
Glucose-Capillary: 134 mg/dL — ABNORMAL HIGH (ref 70–99)
Glucose-Capillary: 137 mg/dL — ABNORMAL HIGH (ref 70–99)
Glucose-Capillary: 139 mg/dL — ABNORMAL HIGH (ref 70–99)

## 2021-09-18 LAB — TROPONIN I (HIGH SENSITIVITY): Troponin I (High Sensitivity): 126 ng/L (ref <18)

## 2021-09-18 LAB — PHOSPHORUS
Phosphorus: 1.6 mg/dL — ABNORMAL LOW (ref 2.5–4.6)
Phosphorus: 3.2 mg/dL (ref 2.5–4.6)

## 2021-09-18 MED ORDER — VITAL HIGH PROTEIN PO LIQD
1000.0000 mL | ORAL | Status: DC
  Administered 2021-09-18 – 2021-09-19 (×2): 1000 mL

## 2021-09-18 MED ORDER — SODIUM CHLORIDE 0.9 % IV SOLN
250.0000 mL | INTRAVENOUS | Status: DC
Start: 2021-09-18 — End: 2021-09-21

## 2021-09-18 MED ORDER — POTASSIUM PHOSPHATES 15 MMOLE/5ML IV SOLN
45.0000 mmol | Freq: Once | INTRAVENOUS | Status: AC
  Administered 2021-09-18: 45 mmol via INTRAVENOUS
  Filled 2021-09-18: qty 15

## 2021-09-18 MED ORDER — PROSOURCE TF PO LIQD
45.0000 mL | Freq: Two times a day (BID) | ORAL | Status: DC
  Administered 2021-09-18 – 2021-09-20 (×5): 45 mL
  Filled 2021-09-18 (×3): qty 45

## 2021-09-18 MED ORDER — NOREPINEPHRINE 4 MG/250ML-% IV SOLN
2.0000 ug/min | INTRAVENOUS | Status: DC
  Administered 2021-09-18: 2 ug/min via INTRAVENOUS
  Filled 2021-09-18: qty 250

## 2021-09-18 NOTE — Progress Notes (Signed)
eLink Physician-Brief Progress Note Patient Name: Kenneth Oneill DOB: August 12, 1960 MRN: [id number]   Date of Service  09/18/2021  HPI/Events of Note  Notified of hypotension with BP 84/62, MAP 70 on burst suppression with versed and propofol gtts.  Pt is post PEA arrest.   eICU Interventions  Ok to add levophed peripherally to maintain MAP >65.      Intervention Category Intermediate Interventions: Hypotension - evaluation and management  Larinda Buttery 09/18/2021, 9:37 PM

## 2021-09-18 NOTE — Progress Notes (Signed)
   NAME:  Kenneth Oneill, MRN:  [id number], DOB:  Sep 02, 1960, LOS: 2 ADMISSION DATE:  08/31/2021, CONSULTATION DATE:  09/18/21 REFERRING MD:  EDP, CHIEF COMPLAINT:  unresponsive   History of Present Illness:  Kenneth Oneill is a 61 y.o. M with PMH significant for bipolar disorder, PTSD, ETOH abuse (wife reports sober for 9 months) possible cocaine/heroin use who fell from standing overnight.  His wife is at the bedside and states that she heard a thud, he was initially awake and holding his head, then slumped over and became unresponsive.  She does not think he had a pulse, but was unable to turn him on his back to start CPR.  It was approximately 9-10 minutes before EMS arrived, Pt was pulseless.  After ~20 minutes CPR ROSC achieved.   UDS positive for opiates and cannabinoids.  Pertinent  Medical History   has a past medical history of Arthritis, Bipolar disorder ([place]), Plantar fasciitis, bilateral, and PTSD (post-traumatic stress disorder).   Significant Hospital Events: Including procedures, antibiotic start and stop dates in addition to other pertinent events   5/26 Cardiac arrest, intubated, admit to PCCM  Interim History / Subjective:  Visible myoclonus has settled out with versed gtt.  Objective   Blood pressure 100/75, pulse 67, temperature 98.6 F (37 C), resp. rate (!) 22, height 5\' 6"  (1.676 m), weight 83 kg, SpO2 100 %.    Vent Mode: PRVC FiO2 (%):  [40 %] 40 % Set Rate:  [22 bmp] 22 bmp Vt Set:  [540 mL] 540 mL PEEP:  [5 cmH20] 5 cmH20 Plateau Pressure:  [13 cmH20-20 cmH20] 20 cmH20   Intake/Output Summary (Last 24 hours) at 09/18/2021 [id number] Last data filed at 09/18/2021 0700 Gross per 24 hour  Intake 3345.17 ml  Output 1170 ml  Net 2175.17 ml    Filed Weights   09/05/2021 0617 09/17/21 0400 09/18/21 0400  Weight: 79.4 kg 82.8 kg 83 kg    Heavily sedated Pupils small and more sluggish +cough Not triggering vent on current sedation Ext warm Heart sounds  regular Bilateral rhonci  Echo okay Labs okay/stable  Resolved Hospital Problem list     Assessment & Plan:   Unwitnessed out of hospital PEA cardiac arrest with subsequent hypoxic respiratory failure - 10 minutes down, 20 mins CPR.   Seems most c/w sequelae of overdose. 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/ resp failure- with question of aspiration Hypokalemia, Hypomagnesemia- both being repleted  - Continue TTM, vent support, VAP prevention bundle - Continue burst suppression with versed, prop, keppra; further titration of these if needed per neuro - Discussed with neurology, repeat exam today, MRI within next 24-48h then will need GOC discussion - Continue vent support/VAP prevention bundle - Continue thiamine/folate - Grim prognosis  Best Practice (right click and "Reselect all SmartList Selections" daily)   Diet/type: tubefeeds DVT prophylaxis: prophylactic heparin  GI prophylaxis: PPI Lines: N/A Foley:  Yes, and it is still needed Code Status:  full code Last date of multidisciplinary goals of care discussion [5/26, see separate IPAL note]  35 min cc time Erskine Emery MD PCCM

## 2021-09-18 NOTE — Procedures (Addendum)
Patient Name: Kenneth Oneill  MRN: [id number]  Epilepsy Attending: Lora Havens  Referring Physician/Provider: Lorenza Chick, MD Duration: 09/17/2021 1022 to 09/18/2021 1022   Patient history:60yo M s/p cardiac arrest with exam highly concerning for potential myoclonic seizures . EEG to evaluate for seizure.   Level of alertness:  comatose   AEDs during EEG study: LEV, propofol, versed   Technical aspects: This EEG study was done with scalp electrodes positioned according to the 10-20 International system of electrode placement. Electrical activity was acquired at a sampling rate of 500Hz  and reviewed with a high frequency filter of 70Hz  and a low frequency filter of 1Hz . EEG data were recorded continuously and digitally stored.    Description: EEG initially showed continuous generalized 3-5hz  theta-delta slowing as well as generalized periodic discharges were noted at 1-1.5Hz . Gradually eeg worsened and showed continuous generalized background suppression. Hyperventilation and photic stimulation were not performed.      ABNORMALITY -Periodic discharges, generalized -Continuous slow, generalized   -Background suppression, generalized   IMPRESSION: This study initially showed evidence of epileptogenicity with generalized onset as well as severe diffuse encephalopathy. Gradually eeg worsened and showed profounf diffuse encephalopathy. 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   Deron Poole Barbra Sarks

## 2021-09-18 NOTE — Progress Notes (Signed)
EEG maint complete.  ?

## 2021-09-18 NOTE — Progress Notes (Signed)
An USGPIV (ultrasound guided PIV) has been placed for short-term vasopressor infusion. A correctly placed ivWatch must be used when administering Vasopressors. Should this treatment be needed beyond 72 hours, central line access should be obtained.  It will be the responsibility of the bedside nurse to follow best practice to prevent extravasations.   ?

## 2021-09-18 NOTE — Progress Notes (Signed)
[facility] ADULT ICU REPLACEMENT PROTOCOL   The patient does apply for the [facility] Adult ICU Electrolyte Replacment Protocol based on the criteria listed below:   1.Exclusion criteria: TCTS patients, ECMO patients, and Dialysis patients 2. Is GFR >/= 30 ml/min? Yes.    Patient's GFR today is >60 3. Is SCr </= 2? Yes.   Patient's SCr is 1.11 mg/dL 4. Did SCr increase >/= 0.5 in 24 hours? No. 5.Pt's weight >40kg  Yes.   6. Abnormal electrolyte(s): Phos 1.6, K+3.5  7. Electrolytes replaced per protocol 8.  Call MD STAT for K+ </= 2.5, Phos </= 1, or Mag </= 1 Physician:  Dr. Stark Bray, Lilia Argue 09/18/2021 3:32 AM

## 2021-09-18 NOTE — Progress Notes (Signed)
RT NOTE: patient currently on continuous EEG and no breathing over set ventilator RR.  Will hold on SBT at this time.  Tolerating current ventilator settings well.  RT will continue to monitor.

## 2021-09-18 NOTE — Progress Notes (Signed)
Approx 2215-2225  Turned pt to R side, acutely desatted to low 80s, coughing, incr RR, gave 100% breaths on vent, suctioned of thick, yellow/tan secretions, with increase in sats.   RT paged and to bedside. Required further suctioning and stat breaths.   Incr versed and prop gtts, Calmer and RR back down to set rate of 22 on vent

## 2021-09-18 NOTE — Progress Notes (Signed)
Pharmacy generated consult placed for US guided IV for vasopressors. Pt already has US guided forearm IV placed on 5/27. Primary RN notified and aware to use existing IV.

## 2021-09-19 ENCOUNTER — Inpatient Hospital Stay (HOSPITAL_COMMUNITY): Payer: No Typology Code available for payment source

## 2021-09-19 DIAGNOSIS — G40409 Other generalized epilepsy and epileptic syndromes, not intractable, without status epilepticus: Secondary | ICD-10-CM | POA: Diagnosis not present

## 2021-09-19 DIAGNOSIS — I469 Cardiac arrest, cause unspecified: Secondary | ICD-10-CM | POA: Diagnosis not present

## 2021-09-19 LAB — BASIC METABOLIC PANEL
Anion gap: 6 (ref 5–15)
BUN: 16 mg/dL (ref 8–23)
CO2: 23 mmol/L (ref 22–32)
Calcium: 8.1 mg/dL — ABNORMAL LOW (ref 8.9–10.3)
Chloride: 111 mmol/L (ref 98–111)
Creatinine, Ser: 1.12 mg/dL (ref 0.61–1.24)
GFR, Estimated: >60 mL/min (ref >60)
Glucose, Bld: 135 mg/dL — ABNORMAL HIGH (ref 70–99)
Potassium: 3.2 mmol/L — ABNORMAL LOW (ref 3.5–5.1)
Sodium: 140 mmol/L (ref 135–145)

## 2021-09-19 LAB — GLUCOSE, CAPILLARY
Glucose-Capillary: 143 mg/dL — ABNORMAL HIGH (ref 70–99)
Glucose-Capillary: 130 mg/dL — ABNORMAL HIGH (ref 70–99)
Glucose-Capillary: 128 mg/dL — ABNORMAL HIGH (ref 70–99)
Glucose-Capillary: 126 mg/dL — ABNORMAL HIGH (ref 70–99)
Glucose-Capillary: 146 mg/dL — ABNORMAL HIGH (ref 70–99)
Glucose-Capillary: 145 mg/dL — ABNORMAL HIGH (ref 70–99)

## 2021-09-19 LAB — CBC
HCT: 35.6 % — ABNORMAL LOW (ref 39.0–52.0)
Hemoglobin: 11.1 g/dL — ABNORMAL LOW (ref 13.0–17.0)
MCH: 31.8 pg (ref 26.0–34.0)
MCHC: 31.2 g/dL (ref 30.0–36.0)
MCV: 102 fL — ABNORMAL HIGH (ref 80.0–100.0)
Platelets: 122 10*3/uL — ABNORMAL LOW (ref 150–400)
RBC: 3.49 MIL/uL — ABNORMAL LOW (ref 4.22–5.81)
RDW: 14.2 % (ref 11.5–15.5)
WBC: 12.8 10*3/uL — ABNORMAL HIGH (ref 4.0–10.5)
nRBC: 0 % (ref 0.0–0.2)

## 2021-09-19 LAB — MAGNESIUM: Magnesium: 1.8 mg/dL (ref 1.7–2.4)

## 2021-09-19 LAB — PHOSPHORUS: Phosphorus: 3.1 mg/dL (ref 2.5–4.6)

## 2021-09-19 MED ORDER — VITAL 1.5 CAL PO LIQD
1000.0000 mL | ORAL | Status: DC
  Administered 2021-09-19: 1000 mL
  Filled 2021-09-19: qty 1000

## 2021-09-19 MED ORDER — POTASSIUM CHLORIDE 20 MEQ PO PACK
20.0000 meq | PACK | ORAL | Status: AC
  Administered 2021-09-19 (×2): 20 meq
  Filled 2021-09-19 (×2): qty 1

## 2021-09-19 MED ORDER — POTASSIUM CHLORIDE 10 MEQ/100ML IV SOLN
10.0000 meq | INTRAVENOUS | Status: AC
  Administered 2021-09-19 (×4): 10 meq via INTRAVENOUS
  Filled 2021-09-19 (×4): qty 100

## 2021-09-19 MED ORDER — MAGNESIUM SULFATE 2 GM/50ML IV SOLN
2.0000 g | Freq: Once | INTRAVENOUS | Status: AC
Start: 2021-09-19 — End: 2021-09-19
  Administered 2021-09-19: 2 g via INTRAVENOUS
  Filled 2021-09-19: qty 50

## 2021-09-19 NOTE — Progress Notes (Signed)
Transported patient to MRI while on the ventilator, patient remained stable during transport.

## 2021-09-19 NOTE — Progress Notes (Signed)
Patient return to unit from MRI.

## 2021-09-19 NOTE — Progress Notes (Signed)
Discontinued cEEG study.  Notified [facility] monitoring.  No skin breakdown observed. 

## 2021-09-19 NOTE — Progress Notes (Signed)
[facility] ADULT ICU REPLACEMENT PROTOCOL   The patient does apply for the [facility] Adult ICU Electrolyte Replacment Protocol based on the criteria listed below:   1.Exclusion criteria: TCTS patients, ECMO patients, and Dialysis patients 2. Is GFR >/= 30 ml/min? Yes.    Patient's GFR today is >60 3. Is SCr </= 2? Yes.   Patient's SCr is 1.12 mg/dL 4. Did SCr increase >/= 0.5 in 24 hours? No. 5.Pt's weight >40kg  Yes.   6. Abnormal electrolyte(s): K+ 3.2, Mag 1.8  7. Electrolytes replaced per protocol 8.  Call MD STAT for K+ </= 2.5, Phos </= 1, or Mag </= 1 Physician:  Dr. Ronnald Ramp, Lilia Argue 09/19/2021 2:31 AM

## 2021-09-19 NOTE — Progress Notes (Signed)
Neurology Progress Note   S:// Patient seen and examined. No acute events overnight Overnight EEG with profound diffuse encephalopathy-no definitive seizures.  In the setting of cardiac arrest, suggestive of anoxic/hypoxic injury with poor prognosis.   O:// Current vital signs: BP 98/75   Pulse 74   Temp 98.6 F (37 C)   Resp (!) 22   Ht 5\' 6"  (1.676 m)   Wt 85.8 kg Comment: 88-2.2 from full pads, weighed x 2  SpO2 94%   BMI 30.53 kg/m  Vital signs in last 24 hours: Temp:  [97.9 F (36.6 C)-98.8 F (37.1 C)] 98.6 F (37 C) (05/29 0715) Pulse Rate:  [64-83] 74 (05/29 0740) Resp:  [22-42] 22 (05/29 0740) BP: (80-136)/(60-99) 98/75 (05/29 0740) SpO2:  [81 %-99 %] 94 % (05/29 0740) FiO2 (%):  [40 %] 40 % (05/29 0740) Weight:  [85.8 kg] 85.8 kg (05/29 0226) General: On sedation with propofol and fentanyl HEENT: Cephalic atraumatic Neurologic exam On sedation No spontaneous movement Off-and-on has some breaths over the ventilator Pupils are very sluggishly reactive Absent corneals bilaterally Weak gag present No response to noxious stimulation in upper or lower extremities   Medications  Current Facility-Administered Medications:    0.9 %  sodium chloride infusion, 250 mL, Intravenous, Continuous, Yap, Vanessa, MD   Ampicillin-Sulbactam (UNASYN) 3 g in sodium chloride 0.9 % 100 mL IVPB, 3 g, Intravenous, Q6H, Jacky Kindle, MD, Stopped at 09/19/21 0440   artificial tears (LACRILUBE) ophthalmic ointment, , Both Eyes, Q4H PRN, Jacky Kindle, MD, 1 application. at 09/19/21 0522   chlorhexidine gluconate (MEDLINE KIT) (PERIDEX) 0.12 % solution 15 mL, 15 mL, Mouth Rinse, BID, Chand, Sudham, MD, 15 mL at 09/18/21 1947   Chlorhexidine Gluconate Cloth 2 % PADS 6 each, 6 each, Topical, Daily, Chand, Sudham, MD, 6 each at 09/19/21 0200   dextrose 5 % in lactated ringers infusion, , Intravenous, Continuous, Candee Furbish, MD, Last Rate: 75 mL/hr at 09/19/21 0700, Infusion Verify  at 09/19/21 0700   docusate (COLACE) 50 MG/5ML liquid 100 mg, 100 mg, Per Tube, BID, Gleason, Otilio Carpen, PA-C, 100 mg at 09/18/21 2159   feeding supplement (PROSource TF) liquid 45 mL, 45 mL, Per Tube, BID, Candee Furbish, MD, 45 mL at 09/18/21 2159   feeding supplement (VITAL HIGH PROTEIN) liquid 1,000 mL, 1,000 mL, Per Tube, Q24H, Candee Furbish, MD, 1,000 mL at 09/18/21 1000   fentaNYL 2562mcg in NS 252mL (71mcg/ml) infusion-PREMIX, 0-400 mcg/hr, Intravenous, Continuous, Palumbo, April, MD, Last Rate: 5 mL/hr at 09/19/21 0816, 50 mcg/hr at 09/19/21 0816   heparin injection 5,000 Units, 5,000 Units, Subcutaneous, Q8H, Gleason, Otilio Carpen, PA-C, 5,000 Units at 09/19/21 0516   insulin aspart (novoLOG) injection 0-15 Units, 0-15 Units, Subcutaneous, Q4H, Harris, Whitney D, NP, 2 Units at 09/19/21 0353   lactated ringers infusion, , Intravenous, Continuous, Jacky Kindle, MD, Stopped at 09/17/21 1910   levETIRAcetam (KEPPRA) IVPB 1000 mg/100 mL premix, 1,000 mg, Intravenous, Q12H, Bhagat, Srishti L, MD, Stopped at 09/19/21 0534   MEDLINE mouth rinse, 15 mL, Mouth Rinse, 10 times per day, Jacky Kindle, MD, 15 mL at 09/19/21 0520   midazolam (VERSED) 100 mg/100 mL (1 mg/mL) premix infusion, 0.5-10 mg/hr, Intravenous, Continuous, Candee Furbish, MD, Last Rate: 2 mL/hr at 09/19/21 0817, 2 mg/hr at 09/19/21 0817   midazolam (VERSED) injection 2 mg, 2 mg, Intravenous, Q1H PRN, Jacky Kindle, MD, 2 mg at 09/12/2021 2241   norepinephrine (LEVOPHED) 4mg  in 242mL (0.016 mg/mL) premix infusion,  2-10 mcg/min, Intravenous, Titrated, Elsie Lincoln, MD, Stopped at 09/19/21 0233   pantoprazole sodium (PROTONIX) 40 mg/20 mL oral suspension 40 mg, 40 mg, Per Tube, Daily, Chand, Sudham, MD, 40 mg at 09/18/21 0953   polyethylene glycol (MIRALAX / GLYCOLAX) packet 17 g, 17 g, Per Tube, Daily, Gleason, Otilio Carpen, PA-C, 17 g at 09/18/21 0953   potassium chloride (KLOR-CON) packet 20 mEq, 20 mEq, Per Tube, Q4H, Elsie Lincoln, MD, 20  mEq at 09/19/21 5027   propofol (DIPRIVAN) 1000 MG/100ML infusion, 0-50 mcg/kg/min, Intravenous, Continuous, Gleason, Otilio Carpen, PA-C, Last Rate: 12.53 mL/hr at 09/19/21 0700, 25 mcg/kg/min at 09/19/21 0700   thiamine (B-1) injection 100 mg, 100 mg, Intravenous, Daily, Gleason, Otilio Carpen, PA-C, 100 mg at 09/18/21 0953 Labs CBC    Component Value Date/Time   WBC 12.8 (H) 09/19/2021 0046   RBC 3.49 (L) 09/19/2021 0046   HGB 11.1 (L) 09/19/2021 0046   HCT 35.6 (L) 09/19/2021 0046   PLT 122 (L) 09/19/2021 0046   MCV 102.0 (H) 09/19/2021 0046   MCH 31.8 09/19/2021 0046   MCHC 31.2 09/19/2021 0046   RDW 14.2 09/19/2021 0046   LYMPHSABS 4.2 (H) 08/25/2021 0455   MONOABS 0.7 09/19/2021 0455   EOSABS 0.0 08/24/2021 0455   BASOSABS 0.0 08/25/2021 0455    CMP     Component Value Date/Time   NA 140 09/19/2021 0046   K 3.2 (L) 09/19/2021 0046   CL 111 09/19/2021 0046   CO2 23 09/19/2021 0046   GLUCOSE 135 (H) 09/19/2021 0046   BUN 16 09/19/2021 0046   CREATININE 1.12 09/19/2021 0046   CALCIUM 8.1 (L) 09/19/2021 0046   PROT 4.7 (L) 09/07/2021 0455   ALBUMIN 2.5 (L) 09/02/2021 0455   AST 88 (H) 08/23/2021 0455   ALT 49 (H) 09/08/2021 0455   ALKPHOS 69 09/06/2021 0455   BILITOT 0.2 (L) 09/03/2021 0455   GFRNONAA >60 09/19/2021 0046   GFRAA >60 06/15/2018 2257    glycosylated hemoglobin  Lipid Panel     Component Value Date/Time   TRIG 230 (H) 09/17/2021 0616     Imaging I have reviewed images in epic and the results pertinent to this consultation are: Initial CT head with no acute abnormality  Assessment: 61 year old man presenting postcardiac arrest and exhibiting myoclonic seizure activity and myoclonic status epilepticus, with initial exam only consistent with partial brainstem reflexes present which still remains the case. No higher cortical functions elicitable at this time. EEG on clinical exam concerning for hypoxic/anoxic brain damage. MRI would be helpful in making  further recommendations  Impression: Evaluate for anoxic brain injury Myoclonic status epilepticus-appears to be resolved  Recommendations: Discontinue LTM Get MRI of the brain Minimize sedation Continue Keppra If he shows clinical seizure activity sedation is being lowered, will reattach to continuous EEG. Clinical exam and EEG till now appears ominous and concerning for anoxic brain injury. Plan discussed with Dr. Tamala Julian  -- Amie Portland, MD Neurologist [facility] Pager: [phone]   CRITICAL CARE ATTESTATION Performed by: Amie Portland, MD Total critical care time: 31 minutes Critical care time was exclusive of separately billable procedures and treating other patients and/or supervising APPs/Residents/Students Critical care was necessary to treat or prevent imminent or life-threatening deterioration due to anoxic brain injury This patient is critically ill and at significant risk for neurological worsening and/or death and care requires constant monitoring. Critical care was time spent personally by me on the following activities: development of treatment plan with patient and/or surrogate  as well as nursing, discussions with consultants, evaluation of patient's response to treatment, examination of patient, obtaining history from patient or surrogate, ordering and performing treatments and interventions, ordering and review of laboratory studies, ordering and review of radiographic studies, pulse oximetry, re-evaluation of patient's condition, participation in multidisciplinary rounds and medical decision making of high complexity in the care of this patient.

## 2021-09-19 NOTE — Procedures (Addendum)
Patient Name: Kenneth Oneill  MRN: [id number]  Epilepsy Attending: Charlsie Quest  Referring Physician/Provider: Gordy Councilman, MD Duration: 09/18/2021 1022 to 09/19/2021 7793   Patient history:60yo M s/p cardiac arrest with exam highly concerning for potential myoclonic seizures . EEG to evaluate for seizure.   Level of alertness:  comatose   AEDs during EEG study: LEV, propofol, versed   Technical aspects: This EEG study was done with scalp electrodes positioned according to the 10-20 International system of electrode placement. Electrical activity was acquired at a sampling rate of 500Hz  and reviewed with a high frequency filter of 70Hz  and a low frequency filter of 1Hz . EEG data were recorded continuously and digitally stored.    Description: EEG showed continuous generalized background suppression which was not reactive to noxious to motion. Hyperventilation and photic stimulation were not performed.      ABNORMALITY -Background suppression, generalized   IMPRESSION: This study is suggestive of profound diffuse encephalopathy.  No definite seizures were seen during the study.  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   [name] 

## 2021-09-19 NOTE — Progress Notes (Signed)
NAME:  Kenneth Oneill, MRN:  [id number], DOB:  Jun 21, 1960, LOS: 3 ADMISSION DATE:  10-02-21, CONSULTATION DATE:  09/19/21 REFERRING MD:  EDP, CHIEF COMPLAINT:  unresponsive   History of Present Illness:  Kenneth Oneill is a 61 y.o. M with PMH significant for bipolar disorder, PTSD, ETOH abuse (wife reports sober for 9 months) possible cocaine/heroin use who fell from standing overnight.  His wife is at the bedside and states that she heard a thud, he was initially awake and holding his head, then slumped over and became unresponsive.  She does not think he had a pulse, but was unable to turn him on his back to start CPR.  It was approximately 9-10 minutes before EMS arrived, Pt was pulseless.  After ~20 minutes CPR ROSC achieved.   UDS positive for opiates and cannabinoids.  Pertinent  Medical History   has a past medical history of Arthritis, Bipolar disorder (HCC), Plantar fasciitis, bilateral, and PTSD (post-traumatic stress disorder).   Significant Hospital Events: Including procedures, antibiotic start and stop dates in addition to other pertinent events   5/26 Cardiac arrest, intubated, admit to PCCM  Interim History / Subjective:  Visible myoclonus has settled out with versed gtt.  Objective   Blood pressure 98/75, pulse 74, temperature 98.6 F (37 C), resp. rate (!) 22, height 5\' 6"  (1.676 m), weight 85.8 kg, SpO2 94 %.    Vent Mode: PRVC FiO2 (%):  [40 %] 40 % Set Rate:  [22 bmp] 22 bmp Vt Set:  [540 mL] 540 mL PEEP:  [5 cmH20] 5 cmH20 Plateau Pressure:  [18 cmH20] 18 cmH20   Intake/Output Summary (Last 24 hours) at 09/19/2021 0935 Last data filed at 09/19/2021 0700 Gross per 24 hour  Intake 4435.36 ml  Output 560 ml  Net 3875.36 ml   Filed Weights   09/17/21 0400 09/18/21 0400 09/19/21 0226  Weight: 82.8 kg 83 kg 85.8 kg   General: chronically and critically ill adult intubated heavily sedated Neuro: weak cough. No response to pain. Sedated HEENT: ETT secure thin  oral secretions. NCAT Pulm: symmetrical chest expansion. Mechanically ventilated CV: rr s1s2 cap refill < 3 sec  GI: obscured by cooling pad GU: foley  MSK: no acute joint deformity   Resolved Hospital Problem list     Assessment & Plan:    OOH PEA arrest with prolonged downtime  -estimated downtime 30 min --  >/=10 min before CPR initiated and 20 min CPR Acute anoxic encephalopathy, possible additional metabolic component  Status epilepticus, improved   Acute hypoxic respiratory failure, aspiration Polysubstance abuse, suspected unintentional overdose Hypokalemia  Hypomagnesemia Anemia - likely multifactorial, etoh abuse + critical illness/iatrogenic blood loss Thrombocytopenia, mild  Leukocytosis  Lactic acidosis improved  Demand ischemia in setting of cardiac arrest  Hypoglycemia, improved P -dc cEEG, get MRI brain  -wean sedation -Keppra -VAP, pulm hygiene  -optimize lytes -cont B vitamin/ MVI support  -dc d5 gtt  -will also dc SSI -- add back if needed off of d5LR  -EN per RDN   Best Practice (right click and "Reselect all SmartList Selections" daily)   Diet/type: tubefeeds DVT prophylaxis: prophylactic heparin  GI prophylaxis: PPI Lines: N/A Foley:  Yes, and it is still needed Code Status:  full code Last date of multidisciplinary goals of care discussion [5/26, see separate IPAL note]  CRITICAL CARE Performed by: 06-03-1992   Total critical care time: 38 minutes  Critical care time was exclusive of separately billable procedures and treating  other patients. Critical care was necessary to treat or prevent imminent or life-threatening deterioration.  Critical care was time spent personally by me on the following activities: development of treatment plan with patient and/or surrogate as well as nursing, discussions with consultants, evaluation of patient's response to treatment, examination of patient, obtaining history from patient or surrogate,  ordering and performing treatments and interventions, ordering and review of laboratory studies, ordering and review of radiographic studies, pulse oximetry and re-evaluation of patient's condition.  Tessie Fass MSN, AGACNP-BC [facility] Pulmonary/Critical Care Medicine Amion for pager  09/19/2021, 9:35 AM

## 2021-09-19 NOTE — Progress Notes (Signed)
eLink Physician-Brief Progress Note Patient Name: Kenneth Oneill DOB: 08/26/1960 MRN: [id number]   Date of Service  09/19/2021  HPI/Events of Note  MRI findings: Restricted diffusion throughout the cerebral cortex bilaterally, as well as involving the entirety of the bilateral caudates and lentiform nuclei as can be seen with anoxic brain injury.   Additional more focal areas of restricted diffusion are also seen in the cerebellar hemispheres, right frontal white matter, and right thalamus.  eICU Interventions  Continue present management.     Intervention Category Major Interventions: Other:  Lenell Antu 09/19/2021, 10:07 PM

## 2021-09-19 NOTE — Progress Notes (Signed)
Initial Nutrition Assessment  DOCUMENTATION CODES:   Not applicable  INTERVENTION:   Tube Feeding via OG:  Vital 1.5 at 55 ml/hr Pro-Source TF 64mL BID This provides 111 g of protein, 2060 kcals and 1003 mL of free water  NUTRITION DIAGNOSIS:   Inadequate oral intake related to acute illness as evidenced by NPO status.  GOAL:   Patient will meet greater than or equal to 90% of their needs  MONITOR:   Vent status, Labs, Weight trends, TF tolerance  REASON FOR ASSESSMENT:   Ventilator, Consult Enteral/tube feeding initiation and management  ASSESSMENT:   61 yo male admitted with OOH PEA arrest with prolonged downtime requiring intubation. PMH includes PTSD, EtOH abuse, possible cocaine and heroin use  Pt remains on vent support, TTM Propofol: weaning off  OG tube presumed to be in stomach per chest xray  Weight up to 85.8 kg; admit weight 79.4 kg  Unable to obtain diet and weight history from pt at this time  Labs: potassium 3.2 (L) Meds: thiamine, LR at 125 ml/hr, miralax, colace, potassium phosphate  Diet Order:   Diet Order     None       EDUCATION NEEDS:   Not appropriate for education at this time  Skin:  Skin Assessment: Skin Integrity Issues: Skin Integrity Issues:: Stage II Stage II: coccyx  Last BM:  5/26  Height:   Ht Readings from Last 1 Encounters:  09/07/2021 5\' 6"  (1.676 m)    Weight:   Wt Readings from Last 1 Encounters:  09/19/21 85.8 kg    BMI:  Body mass index is 30.53 kg/m.  Estimated Nutritional Needs:   Kcal:  2000-2200 kcals  Protein:  115-135 g  Fluid:  >/= 2 L   Kerman Passey MS, RDN, LDN, CNSC Registered Dietitian III Clinical Nutrition RD Pager and On-Call Pager Number Located in [place]

## 2021-09-20 DIAGNOSIS — G9340 Encephalopathy, unspecified: Secondary | ICD-10-CM

## 2021-09-20 DIAGNOSIS — Z7189 Other specified counseling: Secondary | ICD-10-CM

## 2021-09-20 DIAGNOSIS — J9600 Acute respiratory failure, unspecified whether with hypoxia or hypercapnia: Secondary | ICD-10-CM

## 2021-09-20 DIAGNOSIS — G40901 Epilepsy, unspecified, not intractable, with status epilepticus: Secondary | ICD-10-CM

## 2021-09-20 DIAGNOSIS — J9601 Acute respiratory failure with hypoxia: Secondary | ICD-10-CM | POA: Diagnosis not present

## 2021-09-20 DIAGNOSIS — I469 Cardiac arrest, cause unspecified: Secondary | ICD-10-CM | POA: Diagnosis not present

## 2021-09-20 LAB — BASIC METABOLIC PANEL
Anion gap: 7 (ref 5–15)
BUN: 16 mg/dL (ref 8–23)
CO2: 22 mmol/L (ref 22–32)
Calcium: 8.4 mg/dL — ABNORMAL LOW (ref 8.9–10.3)
Chloride: 110 mmol/L (ref 98–111)
Creatinine, Ser: 0.91 mg/dL (ref 0.61–1.24)
GFR, Estimated: >60 mL/min (ref >60)
Glucose, Bld: 123 mg/dL — ABNORMAL HIGH (ref 70–99)
Potassium: 3.5 mmol/L (ref 3.5–5.1)
Sodium: 139 mmol/L (ref 135–145)

## 2021-09-20 LAB — CBC
HCT: 34.2 % — ABNORMAL LOW (ref 39.0–52.0)
Hemoglobin: 11 g/dL — ABNORMAL LOW (ref 13.0–17.0)
MCH: 31.9 pg (ref 26.0–34.0)
MCHC: 32.2 g/dL (ref 30.0–36.0)
MCV: 99.1 fL (ref 80.0–100.0)
Platelets: 134 10*3/uL — ABNORMAL LOW (ref 150–400)
RBC: 3.45 MIL/uL — ABNORMAL LOW (ref 4.22–5.81)
RDW: 13.7 % (ref 11.5–15.5)
WBC: 7.9 10*3/uL (ref 4.0–10.5)
nRBC: 0 % (ref 0.0–0.2)

## 2021-09-20 LAB — TRIGLYCERIDES: Triglycerides: 97 mg/dL

## 2021-09-20 LAB — GLUCOSE, CAPILLARY
Glucose-Capillary: 168 mg/dL — ABNORMAL HIGH (ref 70–99)
Glucose-Capillary: 129 mg/dL — ABNORMAL HIGH (ref 70–99)
Glucose-Capillary: 159 mg/dL — ABNORMAL HIGH (ref 70–99)

## 2021-09-20 LAB — PHOSPHORUS: Phosphorus: 4.1 mg/dL (ref 2.5–4.6)

## 2021-09-20 LAB — MAGNESIUM: Magnesium: 1.9 mg/dL (ref 1.7–2.4)

## 2021-09-20 MED ORDER — ACETAMINOPHEN 325 MG PO TABS: 650.0000 mg | ORAL_TABLET | Freq: Four times a day (QID) | ORAL | Status: DC | PRN

## 2021-09-20 MED ORDER — MIDAZOLAM HCL 2 MG/2ML IJ SOLN: 2.0000 mg | INTRAMUSCULAR | Status: DC | PRN

## 2021-09-20 MED ORDER — DEXTROSE 5 % IV SOLN
INTRAVENOUS | Status: DC
Start: 2021-09-20 — End: 2021-09-21

## 2021-09-20 MED ORDER — MIDAZOLAM BOLUS VIA INFUSION (WITHDRAWAL LIFE SUSTAINING TX): 2.0000 mg | INTRAVENOUS | Status: DC | PRN

## 2021-09-20 MED ORDER — POLYVINYL ALCOHOL 1.4 % OP SOLN: 1.0000 [drp] | Freq: Four times a day (QID) | OPHTHALMIC | Status: DC | PRN

## 2021-09-20 MED ORDER — ACETAMINOPHEN 650 MG RE SUPP: 650.0000 mg | Freq: Four times a day (QID) | RECTAL | Status: DC | PRN

## 2021-09-20 MED ORDER — MIDAZOLAM HCL 2 MG/2ML IJ SOLN
2.0000 mg | INTRAMUSCULAR | Status: DC | PRN
  Administered 2021-09-20 (×2): 2 mg via INTRAVENOUS
  Filled 2021-09-20 (×2): qty 4

## 2021-09-20 MED ORDER — MORPHINE SULFATE (PF) 2 MG/ML IV SOLN: 2.0000 mg | INTRAVENOUS | Status: DC | PRN

## 2021-09-20 MED ORDER — MIDAZOLAM HCL 2 MG/2ML IJ SOLN: 1.0000 mg | INTRAMUSCULAR | Status: DC | PRN

## 2021-09-20 MED ORDER — MORPHINE 100MG IN NS 100ML (1MG/ML) PREMIX INFUSION
0.0000 mg/h | INTRAVENOUS | Status: DC
  Administered 2021-09-20: 5 mg/h via INTRAVENOUS
  Administered 2021-09-20: 20 mg/h via INTRAVENOUS
  Filled 2021-09-20 (×2): qty 100

## 2021-09-20 MED ORDER — MAGNESIUM SULFATE IN D5W 1-5 GM/100ML-% IV SOLN
1.0000 g | Freq: Once | INTRAVENOUS | Status: AC
  Administered 2021-09-20: 1 g via INTRAVENOUS
  Filled 2021-09-20: qty 100

## 2021-09-20 MED ORDER — MIDAZOLAM-SODIUM CHLORIDE 100-0.9 MG/100ML-% IV SOLN
0.0000 mg/h | INTRAVENOUS | Status: DC
  Administered 2021-09-20: 5 mg/h via INTRAVENOUS
  Filled 2021-09-20: qty 100

## 2021-09-20 MED ORDER — DIPHENHYDRAMINE HCL 50 MG/ML IJ SOLN: 25.0000 mg | INTRAMUSCULAR | Status: DC | PRN

## 2021-09-20 MED ORDER — MORPHINE BOLUS VIA INFUSION: 5.0000 mg | INTRAVENOUS | Status: DC | PRN

## 2021-09-20 MED ORDER — POTASSIUM CHLORIDE 20 MEQ PO PACK
40.0000 meq | PACK | Freq: Once | ORAL | Status: AC
  Administered 2021-09-20: 40 meq
  Filled 2021-09-20: qty 2

## 2021-09-20 MED ORDER — GLYCOPYRROLATE 0.2 MG/ML IJ SOLN
0.2000 mg | INTRAMUSCULAR | Status: DC | PRN
  Filled 2021-09-20: qty 1

## 2021-09-20 MED ORDER — GLYCOPYRROLATE 1 MG PO TABS: 1.0000 mg | ORAL_TABLET | ORAL | Status: DC | PRN

## 2021-09-20 MED ORDER — GLYCOPYRROLATE 0.2 MG/ML IJ SOLN
0.2000 mg | INTRAMUSCULAR | Status: DC | PRN
  Administered 2021-09-20: 0.2 mg via INTRAVENOUS

## 2021-09-20 MED ORDER — FENTANYL 2500MCG IN NS 250ML (10MCG/ML) PREMIX INFUSION
0.0000 ug/h | INTRAVENOUS | Status: DC
  Administered 2021-09-20: 400 ug/h via INTRAVENOUS
  Filled 2021-09-20: qty 250

## 2021-09-22 NOTE — IPAL (Signed)
  Interdisciplinary Goals of Care Family Meeting   Date carried out:: 09/19/2021  Location of the meeting: Bedside  Member's involved: Physician, Bedside Registered Nurse, Family Member or next of kin, and Other: PA-C  Durable Power of Attorney or acting medical decision maker: Wife Cleotis Nipper  Discussion: We discussed goals of care for [employer] .  Dr. Merrily Pew and I spoke with wife Cleotis Nipper and sister Magda Paganini at bedside.  Mother was on speaker phone.  We updated family on MRI indicating anoxic brain injury.  Family was updated on poor neurologic recovery prognosis.  Recommended to transition to comfort care.  Family stated they did not want him to be in a vegetative state and agreed that comfort care is the right call.  Family would like to go ahead and make DNR.  Family would like to make sure that donor services evaluate him for possible organ donation.  After that they are okay transitioning to comfort care.  DNR orders placed and awaiting to hear back from [facility].  Code status: Full DNR  Disposition: In-patient comfort care   Time spent for the meeting: 35 minutes  Lidia Collum 09/08/2021, 10:46 AM

## 2021-09-22 NOTE — Progress Notes (Signed)
Pt noted without any signs of life, no respirations, pulseless. Pronounced deceased by two Rns at 64. Wife at bedside at time of death.

## 2021-09-22 NOTE — Plan of Care (Signed)

## 2021-09-22 NOTE — Progress Notes (Signed)
Chaplain responded to [facility] page notification that Kenneth Oneill' family will soon be transitioning him to comfort care. Pt was surrounded by his wife, Kenneth Oneill, and sister Kenneth Oneill. Chaplain asked open ended questions to facilitate emotional expression and story telling. Kenneth Oneill expressed wonderings of whether Kenneth Oneill (what his family of origin calls him) knew that his time was coming to an end when he walked into his room where he subsequently collapsed. Kenneth Oneill shared how difficult it was to move him and how even the EMT's struggled, which caused him to go longer without oxygen. Chaplain engaged family in life review. Kenneth Oneill shared that they are "irish twins" born less than a year apart. Kenneth Oneill and Kenneth Oneill met at a funeral where she was captivated by his pink shirt. Kenneth Oneill had a love and a gift for plants. 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 He loved animals, particularly his service dog Kenneth Oneill, and has named hope in reincarnation, which is an Actuary of his tribal faith, and hopes that he might return as a dog one day. Chaplain provided reflective listening and grief support as well as prayer at the family's request, honoring Kenneth Oneill's baptist and tribal faith.  Please page as further needs arise.  Kenneth Oneill. Elyn Peers, M.Div. [facility] Chaplain Pager [phone] Office [phone]     10-16-2021 1000  Clinical Encounter Type  Visited With Patient and family together  Visit Type Initial;Spiritual support;Psychological support;Patient actively dying  Spiritual Encounters  Spiritual Needs Emotional;Grief support;Prayer

## 2021-09-22 NOTE — Progress Notes (Signed)
NAME:  Kenneth Oneill, MRN:  [id number], DOB:  03-Aug-1960, LOS: 4 ADMISSION DATE:  09/14/2021, CONSULTATION DATE:  09-27-2021 REFERRING MD:  EDP, CHIEF COMPLAINT:  unresponsive   History of Present Illness:  Kenneth Oneill is a 61 y.o. M with PMH significant for bipolar disorder, PTSD, ETOH abuse (wife reports sober for 9 months) possible cocaine/heroin use who fell from standing overnight.  His wife is at the bedside and states that she heard a thud, he was initially awake and holding his head, then slumped over and became unresponsive.  She does not think he had a pulse, but was unable to turn him on his back to start CPR.  It was approximately 9-10 minutes before EMS arrived, Pt was pulseless.  After ~20 minutes CPR ROSC achieved.   UDS positive for opiates and cannabinoids.  Pertinent  Medical History   has a past medical history of Arthritis, Bipolar disorder ([place]), Plantar fasciitis, bilateral, and PTSD (post-traumatic stress disorder).   Significant Hospital Events: Including procedures, antibiotic start and stop dates in addition to other pertinent events   5/26 Cardiac arrest, intubated, admit to PCCM 5/29: MRI estricted diffusion throughout cerebral cortex b/l involving b/l caudates and lentiform nuclei seen w/ anoxic brain injury.  Interim History / Subjective:  Sedated w/ prop, fent, midazolam Remains intubated on PRVC  Objective   Blood pressure (!) 137/103, pulse 83, temperature 97.7 F (36.5 C), temperature source Oral, resp. rate (!) 28, height 5\' 6"  (1.676 m), weight 88.9 kg, SpO2 93 %.    Vent Mode: PRVC FiO2 (%):  [40 %] 40 % Set Rate:  [22 bmp] 22 bmp Vt Set:  [380 mL-540 mL] 380 mL PEEP:  [5 cmH20] 5 cmH20 Plateau Pressure:  [14 cmH20-20 cmH20] 20 cmH20   Intake/Output Summary (Last 24 hours) at 09/27/21 0858 Last data filed at 27-Sep-2021 0800 Gross per 24 hour  Intake 1598.76 ml  Output 665 ml  Net 933.76 ml    Filed Weights   09/18/21 0400 09/19/21 0226  09-27-21 0500  Weight: 83 kg 85.8 kg 88.9 kg   Exam: General:  critically ill appearing on mech vent HEENT: MM pink/moist; ETT in place Neuro: sedated; mild cough/gag present; breathing spontaneously CV: s1s2, no m/r/g PULM: coarse BS bilaterally; on mech vent PRVC GI: soft, bsx4 active  Extremities: warm/dry, trace BLE edema  Skin: no rashes or lesions appreciated  Resolved Hospital Problem list     Assessment & Plan:   OOH PEA arrest with prolonged downtime  -estimated downtime 30 min --  >/=10 min before CPR initiated and 20 min CPR Hypokalemia  Hypomagnesemia Lactic acidosis improved  Demand ischemia in setting of cardiac arrest  P: -telemetry monitoring -replete K/mag this am -trend electrolytes and replete as needed  Acute anoxic encephalopathy, possible additional metabolic component ; MRI [id number] restricted diffusion throughout cerebral cortex b/l involving b/l caudates and lentiform nuclei seen w/ anoxic brain injury. Status epilepticus, improved   Polysubstance abuse, suspected unintentional overdose P: -neuro following -LTM stopped 5/29 -MRI and EEG appear to be related to anoxic brain injury -cont keppra -prn versed for seizure -limit sedating meds; wean sedation for RASS 0 to -1 -cont thiamine, folate, mvi  Acute hypoxic respiratory failure, aspiration P: -LTVV strategy with tidal volumes of 6-8 cc/kg ideal body weight -Wean PEEP/FiO2 for SpO2 >92% -VAP bundle in place -Daily SAT and SBT -PAD protocol in place -wean sedation for RASS goal 0 to -1  Anemia - likely multifactorial, etoh abuse +  critical illness/iatrogenic blood loss Thrombocytopenia, mild; trending up Leukocytosis: improved P: -trend CBC  Hypoglycemia, improved P: -cbg monitoring -cont to hold SSI -EN per RDN  Best Practice (right click and "Reselect all SmartList Selections" daily)   Diet/type: tubefeeds DVT prophylaxis: prophylactic heparin  GI prophylaxis: PPI Lines:  N/A Foley:  Yes, and it is still needed Code Status:  full code Last date of multidisciplinary goals of care discussion [5/26, see separate IPAL note; 5/30 spoke with wife at bedside and updated MRI results and poor neurologic prognosis; she is waiting for patient's sister to arrive but is wanting to possibly go comfort care today.]  CRITICAL CARE Performed by: Mick Sell  Total critical care time: 35 minutes  JD Geryl Rankins Pulmonary & Critical Care October 10, 2021, 9:33 AM  Please see [email or web address] for pager details.  From 7A-7P if no response, please call [phone]. After hours, please call ELink [phone].

## 2021-09-22 NOTE — Progress Notes (Signed)
RT NOTE: RT extubated patient to comfort care per order and family wishes.  

## 2021-09-22 NOTE — Progress Notes (Addendum)
Neurology Progress Note   S:// Patient seen and examined. With reduction in sedation had some shivering for which Versed was started MRI completed and reviewed. Suggestive of diffuse and severe anoxic brain injury   O:// Current vital signs: BP (!) 137/103 (BP Location: Right Arm)   Pulse 83   Temp 97.7 F (36.5 C) (Oral)   Resp (!) 28   Ht 5' 6"  (1.676 m)   Wt 88.9 kg   SpO2 93%   BMI 31.63 kg/m  Vital signs in last 24 hours: Temp:  [97.7 F (36.5 C)-98.8 F (37.1 C)] 97.7 F (36.5 C) (05/30 0814) Pulse Rate:  [73-93] 83 (05/30 0800) Resp:  [22-49] 28 (05/30 0800) BP: (101-155)/(72-103) 137/103 (05/30 0800) SpO2:  [88 %-97 %] 93 % (05/30 0800) FiO2 (%):  [40 %] 40 % (05/30 0800) Weight:  [88.9 kg] 88.9 kg (05/30 0500) General: Intubated sedated, sedation held for exam HEENT: Normocephalic atraumatic atraumatic Neurologic exam On sedation-propofol held for exam. No spontaneous movement Off-and-on has some breaths over the ventilator Pupils are very sluggishly reactive, gaze disconjugate and slightly upward. Absent corneals bilaterally Weak gag present No response to noxious stimulation in upper or lower extremities   Medications  Current Facility-Administered Medications:    0.9 %  sodium chloride infusion, 250 mL, Intravenous, Continuous, Elsie Lincoln, MD   Ampicillin-Sulbactam (UNASYN) 3 g in sodium chloride 0.9 % 100 mL IVPB, 3 g, Intravenous, Q6H, Icard, Bradley L, DO, Last Rate: 200 mL/hr at 13-Oct-2021 0929, 3 g at 10-13-2021 0929   artificial tears (LACRILUBE) ophthalmic ointment, , Both Eyes, Q4H PRN, Jacky Kindle, MD, 1 application. at 09/19/21 0522   chlorhexidine gluconate (MEDLINE KIT) (PERIDEX) 0.12 % solution 15 mL, 15 mL, Mouth Rinse, BID, Chand, Sudham, MD, 15 mL at 10-13-21 0753   Chlorhexidine Gluconate Cloth 2 % PADS 6 each, 6 each, Topical, Daily, Chand, Currie Paris, MD, 6 each at 09/19/21 1009   docusate (COLACE) 50 MG/5ML liquid 100 mg, 100 mg, Per  Tube, BID, Gleason, Otilio Carpen, PA-C, 100 mg at 09/19/21 2209   feeding supplement (PROSource TF) liquid 45 mL, 45 mL, Per Tube, BID, Candee Furbish, MD, 45 mL at 09/19/21 2210   feeding supplement (VITAL 1.5 CAL) liquid 1,000 mL, 1,000 mL, Per Tube, Continuous, Icard, Bradley L, DO, Last Rate: 55 mL/hr at 09/19/21 1506, 1,000 mL at 09/19/21 1506   fentaNYL 2565mg in NS 2559m(1018mml) infusion-PREMIX, 0-400 mcg/hr, Intravenous, Continuous, Palumbo, April, MD, Last Rate: 10 mL/hr at 05/06-22-202300, 100 mcg/hr at 08/27/20/2300   heparin injection 5,000 Units, 5,000 Units, Subcutaneous, Q8H, Gleason, LauOtilio CarpenA-C, 5,000 Units at 08/2021/09/2198   lactated ringers infusion, , Intravenous, Continuous, Chand, SudCurrie ParisD, Stopped at 09/17/21 1910   levETIRAcetam (KEPPRA) IVPB 1000 mg/100 mL premix, 1,000 mg, Intravenous, Q12H, Bhagat, Srishti L, MD, Stopped at 05/06-22-2316   MEDLINE mouth rinse, 15 mL, Mouth Rinse, 10 times per day, ChaJacky KindleD, 15 mL at 08/27/20/2348   midazolam (VERSED) 100 mg/100 mL (1 mg/mL) premix infusion, 0.5-10 mg/hr, Intravenous, Continuous, SmiCandee FurbishD, Last Rate: 5 mL/hr at 08/2021-09-2198, 5 mg/hr at 05/Jun 22, 202300   midazolam (VERSED) injection 2 mg, 2 mg, Intravenous, Q1H PRN, ChaJacky KindleD, 2 mg at 09/10/2021 2241   norepinephrine (LEVOPHED) 4mg60m 250mL15m016 mg/mL) premix infusion, 2-10 mcg/min, Intravenous, Titrated, Yap, Elsie Lincoln Stopped at 09/19/21 0233   pantoprazole sodium (PROTONIX) 40 mg/20 mL oral suspension 40 mg, 40 mg, Per Tube,  Daily, Jacky Kindle, MD, 40 mg at 09/19/21 1009   polyethylene glycol (MIRALAX / GLYCOLAX) packet 17 g, 17 g, Per Tube, Daily, Gleason, Otilio Carpen, PA-C, 17 g at 09/19/21 1009   propofol (DIPRIVAN) 1000 MG/100ML infusion, 0-50 mcg/kg/min, Intravenous, Continuous, Gleason, Otilio Carpen, PA-C, Last Rate: 10.02 mL/hr at 10/19/21 0809, 20 mcg/kg/min at 19-Oct-2021 0809   thiamine (B-1) injection 100 mg, 100 mg, Intravenous, Daily,  Gleason, Otilio Carpen, PA-C, 100 mg at 10/19/21 0930 Labs CBC    Component Value Date/Time   WBC 7.9 10-19-21 0511   RBC 3.45 (L) Oct 19, 2021 0511   HGB 11.0 (L) 19-Oct-2021 0511   HCT 34.2 (L) 2021-10-19 0511   PLT 134 (L) Oct 19, 2021 0511   MCV 99.1 10/19/21 0511   MCH 31.9 10/19/21 0511   MCHC 32.2 2021-10-19 0511   RDW 13.7 19-Oct-2021 0511   LYMPHSABS 4.2 (H) 09/12/2021 0455   MONOABS 0.7 09/19/2021 0455   EOSABS 0.0 09/10/2021 0455   BASOSABS 0.0 09/15/2021 0455    CMP     Component Value Date/Time   NA 139 10-19-2021 0511   K 3.5 10-19-21 0511   CL 110 10-19-2021 0511   CO2 22 10/19/2021 0511   GLUCOSE 123 (H) 10/19/2021 0511   BUN 16 10-19-21 0511   CREATININE 0.91 10-19-2021 0511   CALCIUM 8.4 (L) Oct 19, 2021 0511   PROT 4.7 (L) 08/26/2021 0455   ALBUMIN 2.5 (L) 09/01/2021 0455   AST 88 (H) 08/24/2021 0455   ALT 49 (H) 09/04/2021 0455   ALKPHOS 69 09/15/2021 0455   BILITOT 0.2 (L) 09/11/2021 0455   GFRNONAA >60 Oct 19, 2021 0511   GFRAA >60 06/15/2018 2257     Imaging I have reviewed images in epic and the results pertinent to this consultation are: Initial CT head with no acute abnormality MRI brain reviewed: Diffuse hyperintensity on T2/FLAIR images involving the whole cortex, deep gray matter and scattered areas of restricted diffusion in the cerebellum bilaterally and supratentorially as well.  This can suggestive of diffuse anoxic brain injury.  Assessment: 61 year old man presenting postcardiac arrest and exhibiting myoclonic seizure activity and myoclonic status epilepticus, with initial exam only consistent with partial brainstem reflexes present which still remains the case. No higher cortical functions elicitable at this time. EEG on clinical exam concerning for hypoxic/anoxic brain damage.  MRI also looks consistent with severe diffuse anoxic brain injury. Prolonged downtime prior to presentation, poor exam on presentation, myoclonic status  epilepticus soon after cardiac arrest and continued poor exam along with MRI findings and EEG findings consistent with severe diffuse anoxic brain injury portend an extremely poor prognosis for neurological meaningful recovery.  Impression: Severe anoxic brain injury postcardiac arrest  Recommendations: Discussed with the significant other at bedside that the chances for neurologically meaningful recovery are grim given the above. She says her family including the patient's mother and sister are going to be here to discuss with the team in person and have come to terms with the fact that he will probably not survive this insult.  They do not want him to be prolonged ventilated to be in a vegetative state. She did inquire about organ donation-I would defer that to the primary team. Minimize sedation as tolerated I will be available as needed for any further questions. I discussed my plan in detail with Dr. Tacy Learn and JD Rollene Rotunda PA-C  -- Amie Portland, MD Neurologist [facility] Pager: [phone]   [place] Performed by: Amie Portland, MD Total critical care time:  35 minutes Critical care time was exclusive of separately billable procedures and treating other patients and/or supervising APPs/Residents/Students Critical care was necessary to treat or prevent imminent or life-threatening deterioration due to anoxic brain injury This patient is critically ill and at significant risk for neurological worsening and/or death and care requires constant monitoring. Critical care was time spent personally by me on the following activities: development of treatment plan with patient and/or surrogate as well as nursing, discussions with consultants, evaluation of patient's response to treatment, examination of patient, obtaining history from patient or surrogate, ordering and performing treatments and interventions, ordering and review of laboratory studies, ordering and review  of radiographic studies, pulse oximetry, re-evaluation of patient's condition, participation in multidisciplinary rounds and medical decision making of high complexity in the care of this patient.

## 2021-09-22 NOTE — Death Summary Note (Signed)
DEATH SUMMARY   Patient Details  Name: Kenneth Oneill MRN: [id number] DOB: 1960/12/31  Admission/Discharge Information   Admit Date:  2021/09/22  Date of Death: Date of Death: 2021/09/26  Time of Death: Time of Death: 1957  Length of Stay: 4  Referring Physician: Pcp, No   Reason(s) for Hospitalization  Out of hospital [facility] cardiac arrest with prolonged downtime Severe anoxic brain injury Diffuse cerebral edema Acute anoxic encephalopathy Status myoclonus Polysubstance abuse Hypokalemia/hypomagnesemia [place] cardiac ischemia Aspiration pneumonia Anemia of critical illness Lactic acidosis  Diagnoses  Preliminary cause of death:   Withdrawal of care due to severe anoxic brain injury from cardiac arrest Secondary Diagnoses (including complications and co-morbidities):  Principal Problem:   Cardiac arrest [facility]) Active Problems:   Pressure injury of skin   Acute respiratory failure ([place])   Acute encephalopathy   Status epilepticus ([place])   Goals of care, counseling/discussion   Brief Hospital Course (including significant findings, care, treatment, and services provided and events leading to death)  Kenneth Oneill is a 61 y.o. year old male who with PMH significant for bipolar disorder, PTSD, ETOH abuse (wife reports sober for 9 months) possible cocaine/heroin use who fell from standing overnight.  His wife is at the bedside and states that she heard a thud, he was initially awake and holding his head, then slumped over and became unresponsive.  She does not think he had a pulse, but was unable to turn him on his back to start CPR.  It was approximately 9-10 minutes before EMS arrived, Pt was pulseless.  After ~20 minutes CPR ROSC achieved.   UDS positive for opiates and cannabinoids.  Patient was admitted to ICU, temperature management protocol was carried out.  EEG was done which was stopped status myoclonus, neurology was consulted, patient was continued on propofol, fentanyl and  midazolam to help suppress the status myoclonus but he continued to have despite multiple medications including Keppra and valproic acid.  Patient had MRI brain which showed severe anoxic brain injury, goals of care discussions were carried with the family, considering patient has severe anoxic brain injury after cardiac arrest, they decided to proceed with comfort care and palliative extubation.  Comfort care orders were written and patient was palliatively extubated on 09/03/2021.  He was moved to palliative care floor and passed on 26-Sep-2021 at 7:57 PM.  Patient family was at bedside    Pertinent Labs and Studies  Significant Diagnostic Studies CT HEAD WO CONTRAST  Result Date: 09-22-2021 CLINICAL DATA:  Patient was found down. EXAM: CT HEAD WITHOUT CONTRAST CT CERVICAL SPINE WITHOUT CONTRAST TECHNIQUE: Multidetector CT imaging of the head and cervical spine was performed following the standard protocol without intravenous contrast. Multiplanar CT image reconstructions of the cervical spine were also generated. RADIATION DOSE REDUCTION: This exam was performed according to the departmental dose-optimization program which includes automated exposure control, adjustment of the mA and/or kV according to patient size and/or use of iterative reconstruction technique. COMPARISON:  02/03/2017 FINDINGS: CT HEAD FINDINGS Brain: Exam is markedly motion degraded despite repeat scanning. No gross acute hemorrhage. No evidence for hydrocephalus or gross abnormal extra-axial fluid collection. Subtle subarachnoid hemorrhage or areas of subtle acute ischemia could be obscured by the motion degradation. Vascular: Obscured by motion artifact. Skull: No discernible skull fracture given the motion artifact. Sinuses/Orbits: The visualized paranasal sinuses and mastoid air cells are clear. Visualized portions of the globes and intraorbital fat are unremarkable. Other: None. CT CERVICAL SPINE FINDINGS Alignment: Normal. Skull  base  and vertebrae: No acute fracture. No primary bone lesion or focal pathologic process. Soft tissues and spinal canal: No prevertebral fluid or swelling. No visible canal hematoma. Disc levels: Mild loss of disc height at C5-6 and C6-7. Facets are well aligned bilaterally. Upper chest: Emphysema. Other: None. IMPRESSION: 1. Markedly motion degraded study of the brain despite repeat scanning. No gross acute intracranial abnormality. Subtle subarachnoid hemorrhage or areas of acute ischemia could be obscured by the motion degradation. 2. No evidence for cervical spine fracture or subluxation. Electronically Signed   By: Misty Stanley M.D.   On: 08/23/2021 06:07   CT Cervical Spine Wo Contrast  Result Date: 09/14/2021 CLINICAL DATA:  Patient was found down. EXAM: CT HEAD WITHOUT CONTRAST CT CERVICAL SPINE WITHOUT CONTRAST TECHNIQUE: Multidetector CT imaging of the head and cervical spine was performed following the standard protocol without intravenous contrast. Multiplanar CT image reconstructions of the cervical spine were also generated. RADIATION DOSE REDUCTION: This exam was performed according to the departmental dose-optimization program which includes automated exposure control, adjustment of the mA and/or kV according to patient size and/or use of iterative reconstruction technique. COMPARISON:  02/03/2017 FINDINGS: CT HEAD FINDINGS Brain: Exam is markedly motion degraded despite repeat scanning. No gross acute hemorrhage. No evidence for hydrocephalus or gross abnormal extra-axial fluid collection. Subtle subarachnoid hemorrhage or areas of subtle acute ischemia could be obscured by the motion degradation. Vascular: Obscured by motion artifact. Skull: No discernible skull fracture given the motion artifact. Sinuses/Orbits: The visualized paranasal sinuses and mastoid air cells are clear. Visualized portions of the globes and intraorbital fat are unremarkable. Other: None. CT CERVICAL SPINE FINDINGS  Alignment: Normal. Skull base and vertebrae: No acute fracture. No primary bone lesion or focal pathologic process. Soft tissues and spinal canal: No prevertebral fluid or swelling. No visible canal hematoma. Disc levels: Mild loss of disc height at C5-6 and C6-7. Facets are well aligned bilaterally. Upper chest: Emphysema. Other: None. IMPRESSION: 1. Markedly motion degraded study of the brain despite repeat scanning. No gross acute intracranial abnormality. Subtle subarachnoid hemorrhage or areas of acute ischemia could be obscured by the motion degradation. 2. No evidence for cervical spine fracture or subluxation. Electronically Signed   By: Misty Stanley M.D.   On: 09/10/2021 06:07   MR BRAIN WO CONTRAST  Result Date: 09/19/2021 CLINICAL DATA:  Anoxic brain injury, cardiac arrest EXAM: MRI HEAD WITHOUT CONTRAST TECHNIQUE: Multiplanar, multiecho pulse sequences of the brain and surrounding structures were obtained without intravenous contrast. COMPARISON:  No prior MRI, correlation is made with 09/19/2021 CT head FINDINGS: Brain: Diffuse cortical restricted diffusion in the bilateral cerebral hemispheres, somewhat sparing the anterior temporal lobes. Additional diffuse restricted diffusion is seen in the bilateral caudate and lentiform nuclei. More punctate foci of restricted diffusion in the right thalamus, right frontal lobe white matter, and bilateral cerebral hemispheres. These areas are associated with increased T2 signal and the cortical areas are gyral swelling. No acute hemorrhage, mass, mass effect, or midline shift. No hydrocephalus or extra-axial collection. Vascular: Normal arterial flow voids. Skull and upper cervical spine: Normal marrow signal. Sinuses/Orbits: Mucosal thickening throughout the paranasal sinuses. The orbits are unremarkable. Other: The mastoids are well aerated. IMPRESSION: Restricted diffusion throughout the cerebral cortex bilaterally, as well as involving the entirety of the  bilateral caudates and lentiform nuclei as can be seen with anoxic brain injury. Additional more focal areas of restricted diffusion are also seen in the cerebellar hemispheres, right frontal white matter, and right thalamus.  These results will be called to the ordering clinician or representative by the Radiologist Assistant, and communication documented in the PACS or [employer]. Electronically Signed   By: Merilyn Baba M.D.   On: 09/19/2021 21:17   DG Chest Port 1 View  Result Date: 09/19/2021 CLINICAL DATA:  Intubated EXAM: PORTABLE CHEST 1 VIEW COMPARISON:  Prior chest x-ray 09/11/2021 FINDINGS: The tip of the endotracheal tube is 5.6 cm above the carina. A gastric tube is present. The tip lies off the field of view but below the diaphragm and presumably in the stomach. Modest enlargement of the cardiopericardial silhouette is similar compared to prior. New bibasilar airspace opacities partially obscure the diaphragms. Given overall low lung volumes, findings are favored to reflect atelectasis. Small bilateral pleural effusions are also suspected. Mild pulmonary vascular congestion without overt edema. No pneumothorax. IMPRESSION: 1. Lower inspiratory volumes with new bibasilar airspace opacities favored to reflect atelectasis. Small pleural effusions may also be present. 2. Interval placement of a gastric tube. The tip of the tube lies off the field of view, below the diaphragm and likely in the stomach. 3. Stable position of endotracheal tube approximately 5.6 cm above the carina. 4. Stable cardiomegaly and pulmonary vascular congestion without overt edema. Electronically Signed   By: Jacqulynn Cadet M.D.   On: 09/19/2021 07:29   DG Chest Portable 1 View  Result Date: 09/08/2021 CLINICAL DATA:  Status post CPR. EXAM: PORTABLE CHEST 1 VIEW COMPARISON:  11/21/2019 FINDINGS: 0450 hours. If tracheal tube tip is approximately 7.5 cm above the base of the carina. Low volume film. The cardio  pericardial silhouette is enlarged. Vascular congestion evident. Diffuse interstitial opacity suggests edema. No pneumothorax or evidence of pleural effusion. Telemetry leads overlie the chest. IMPRESSION: Low volume film with vascular congestion and probable interstitial edema. Electronically Signed   By: Misty Stanley M.D.   On: 08/22/2021 05:00   DG Abd Portable 1V  Result Date: 08/29/2021 CLINICAL DATA:  OG tube placement EXAM: PORTABLE ABDOMEN - 1 VIEW COMPARISON:  None Available. FINDINGS: Esophagogastric tube with tip and side port below the diaphragm. Nonobstructive pattern of partially included bowel gas. No obvious free air. IMPRESSION: Esophagogastric tube with tip and side port below the diaphragm. Electronically Signed   By: Delanna Ahmadi M.D.   On: 08/28/2021 12:01   EEG adult  Result Date: 09/15/2021 Lora Havens, MD     09/11/2021 12:32 PM Patient Name: Clair Diab MRN: [id number] Epilepsy Attending: Lora Havens Referring Physician/Provider: Lorenza Chick, MD Date: 09/01/2021 Duration: 23.45 mins Patient history:60yo M s/p cardiac arrest with exam highly concerning for potential myoclonic seizures . EEG to evaluate for seizure. Level of alertness:  comatose AEDs during EEG study: LEV, propofol Technical aspects: This EEG study was done with scalp electrodes positioned according to the 10-20 International system of electrode placement. Electrical activity was acquired at a sampling rate of 500Hz  and reviewed with a high frequency filter of 70Hz  and a low frequency filter of 1Hz . EEG data were recorded continuously and digitally stored. Description: Patient was noted to have episodes of whole body brief jerk along with bilateral spontaneous eye opening. Concomitant eeg showed generalized polyspikes consistent with myoclonic seizures. In between seizures, EEG showed continuous generalized background suppression. Hyperventilation and photic stimulation were not performed.    ABNORMALITY -Myoclonic seizure, generalized -Background suppression, generalized  IMPRESSION: This study showed myoclonic seizures every few seconds as well as profound diffuse encephalopathy. 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 Dr Tacy Learn and Dr Theda Sers were notified. Priyanka Barbra Sarks   Overnight EEG with video  Result Date: 09/17/2021 Lora Havens, MD     09/18/2021  9:02 AM Patient Name: Sasha Hauer MRN: [id number] Epilepsy Attending: Lora Havens Referring Physician/Provider: Lorenza Chick, MD Duration: 08/23/2021 1022 to 09/17/2021 1022  Patient history:60yo M s/p cardiac arrest with exam highly concerning for potential myoclonic seizures . EEG to evaluate for seizure.  Level of alertness:  comatose  AEDs during EEG study: LEV, propofol  Technical aspects: This EEG study was done with scalp electrodes positioned according to the 10-20 International system of electrode placement. Electrical activity was acquired at a sampling rate of 500Hz  and reviewed with a high frequency filter of 70Hz  and a low frequency filter of 1Hz . EEG data were recorded continuously and digitally stored.  Description: At the beginning of the study, patient was noted to have episodes of whole body brief jerk along with bilateral spontaneous eye opening. Concomitant eeg showed generalized polyspikes consistent with myoclonic seizures. In between seizures, EEG showed continuous generalized background suppression. Gradually, the semiology of myoclonic seizures changed and only showed eye opening with facial twitching every 1-3 seconds. In between seizures, EEG showed continuous generalized 3-5hz  theta-delta slowing. Hyperventilation and photic stimulation were not performed.    ABNORMALITY -Myoclonic seizure, generalized -Continuous slow, generalized   IMPRESSION: This study showed myoclonic seizures every few seconds as well as severe diffuse encephalopathy. 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  Lora Havens   ECHOCARDIOGRAM COMPLETE  Result Date: 08/27/2021    ECHOCARDIOGRAM REPORT   Patient Name:   KAEVON CORIELL Date of Exam: 09/06/2021 Medical Rec #:  [id number]      Height:       66.0 in Accession #:    [id number]     Weight:       175.0 lb Date of Birth:  30-Sep-1960      BSA:          1.890 m Patient Age:    61 years       BP:           117/76 mmHg Patient Gender: M              HR:           84 bpm. Exam Location:  Inpatient Procedure: 2D Echo, Cardiac Doppler and Color Doppler Indications:    Cardiac arrest  History:        Patient has no prior history of Echocardiogram examinations.  Sonographer:    Jefferey Pica Referring Phys: [id number] [place]  1. Left ventricular ejection fraction, by estimation, is 60 to 65%. The left ventricle has normal function. The left ventricle has no regional wall motion abnormalities. Indeterminate diastolic filling due to E-A fusion.  2. Right ventricular systolic function is normal. The right ventricular size is mildly enlarged. Tricuspid regurgitation signal is inadequate for assessing PA pressure.  3. The mitral valve is normal in structure. No evidence of mitral valve regurgitation. No evidence of mitral stenosis.  4. The aortic valve is grossly normal. Aortic valve regurgitation is not visualized. No aortic stenosis is present.  5. Aortic dilatation noted. There is borderline dilatation of the ascending aorta, measuring 39 mm.  6. The inferior vena cava is dilated in size with >50% respiratory variability, suggesting right atrial pressure of 8 mmHg.  FINDINGS  Left Ventricle: Left ventricular ejection fraction, by estimation, is 60 to 65%. The left ventricle has normal function. The left ventricle has no regional wall motion abnormalities. The left ventricular internal cavity size was normal in size. There is  no left ventricular  hypertrophy. Indeterminate diastolic filling due to E-A fusion. Right Ventricle: The right ventricular size is mildly enlarged. No increase in right ventricular wall thickness. Right ventricular systolic function is normal. Tricuspid regurgitation signal is inadequate for assessing PA pressure. Left Atrium: Left atrial size was normal in size. Right Atrium: Right atrial size was normal in size. Pericardium: There is no evidence of pericardial effusion. Mitral Valve: The mitral valve is normal in structure. No evidence of mitral valve regurgitation. No evidence of mitral valve stenosis. Tricuspid Valve: The tricuspid valve is normal in structure. Tricuspid valve regurgitation is not demonstrated. No evidence of tricuspid stenosis. Aortic Valve: The aortic valve is grossly normal. Aortic valve regurgitation is not visualized. No aortic stenosis is present. Aortic valve peak gradient measures 6.2 mmHg. Pulmonic Valve: The pulmonic valve was normal in structure. Pulmonic valve regurgitation is not visualized. No evidence of pulmonic stenosis. Aorta: The aortic root is normal in size and structure and aortic dilatation noted. There is borderline dilatation of the ascending aorta, measuring 39 mm. Venous: The inferior vena cava is dilated in size with greater than 50% respiratory variability, suggesting right atrial pressure of 8 mmHg. IAS/Shunts: No atrial level shunt detected by color flow Doppler.  LEFT VENTRICLE PLAX 2D LVIDd:         5.00 cm   Diastology LVIDs:         3.00 cm   LV e' medial:    9.43 cm/s LV PW:         1.00 cm   LV E/e' medial:  7.9 LV IVS:        1.10 cm   LV e' lateral:   9.68 cm/s LVOT diam:     2.00 cm   LV E/e' lateral: 7.7 LV SV:         71 LV SV Index:   38 LVOT Area:     3.14 cm  RIGHT VENTRICLE         IVC TAPSE (M-mode): 2.4 cm  IVC diam: 2.50 cm LEFT ATRIUM             Index        RIGHT ATRIUM           Index LA diam:        3.50 cm 1.85 cm/m   RA Area:     11.90 cm LA Vol (A2C):    31.0 ml 16.40 ml/m  RA Volume:   27.30 ml  14.45 ml/m LA Vol (A4C):   40.7 ml 21.54 ml/m LA Biplane Vol: 36.3 ml 19.21 ml/m  AORTIC VALVE                 PULMONIC VALVE AV Area (Vmax): 2.84 cm     PV Vmax:       0.80 m/s AV Vmax:        125.00 cm/s  PV Peak grad:  2.6 mmHg AV Peak Grad:   6.2 mmHg LVOT Vmax:      113.00 cm/s LVOT Vmean:     69.800 cm/s LVOT VTI:       0.227 m  AORTA Ao Root diam: 3.80 cm Ao Asc diam:  3.90 cm MITRAL VALVE MV Area (PHT): 4.46 cm  SHUNTS MV Decel Time: 170 msec    Systemic VTI:  0.23 m MV E velocity: 74.60 cm/s  Systemic Diam: 2.00 cm MV A velocity: 73.70 cm/s MV E/A ratio:  1.01 Cherlynn Kaiser MD Electronically signed by Cherlynn Kaiser MD Signature Date/Time: 08/26/2021/4:24:39 PM    Final    CT Angio Chest/Abd/Pel for Dissection W and/or W/WO  Result Date: 09/08/2021 CLINICAL DATA:  CPR.  Wide mediastinum EXAM: CT ANGIOGRAPHY CHEST, ABDOMEN AND PELVIS TECHNIQUE: Non-contrast CT of the chest was initially obtained. Multidetector CT imaging through the chest, abdomen and pelvis was performed using the standard protocol during bolus administration of intravenous contrast. Multiplanar reconstructed images and MIPs were obtained and reviewed to evaluate the vascular anatomy. RADIATION DOSE REDUCTION: This exam was performed according to the departmental dose-optimization program which includes automated exposure control, adjustment of the mA and/or kV according to patient size and/or use of iterative reconstruction technique. CONTRAST:  176mL OMNIPAQUE IOHEXOL 350 MG/ML SOLN COMPARISON:  Renal stone CT 02/21/2020 FINDINGS: CTA CHEST FINDINGS Cardiovascular: Preferential opacification of the thoracic aorta. No evidence of thoracic aortic aneurysm or dissection. Normal heart size. No pericardial effusion. Very motion degraded postcontrast imaging, no gross pulmonary embolism. Mediastinum/Nodes: No hematoma or pneumomediastinum Lungs/Pleura: There is no edema, consolidation,  effusion, or pneumothorax. Endotracheal tube with tip at the thoracic inlet. Musculoskeletal: Subtle angulation of anterior rib cortices, especially on the left at the third and fourth ribs and right at the fourth rib. Negative for spinal fracture or malalignment. Review of the MIP images confirms the above findings. CTA ABDOMEN AND PELVIS FINDINGS VASCULAR Aorta: Atheromatous plaque.  No dissection or aneurysm. Celiac: Diffusely patent as permitted by artifact. Atheromatous calcification along the splenic artery SMA: Motion degraded.  Major branches are diffusely patent Renals: Duplicated left renal arteries. Vessels are smooth and diffusely patent IMA: Diffusely patent Inflow: Diffusely patent with mild atheromatous plaque Veins: Unremarkable in the arterial phase Review of the MIP images confirms the above findings. NON-VASCULAR Hepatobiliary: No focal liver abnormality.No visible cholecystitis or ductal dilatation. Pancreas: Prominent dimensions of the distal main pancreatic duct without gross mass or acute inflammation. Spleen: Unremarkable. Adrenals/Urinary Tract: Negative adrenals. No hydronephrosis or stone. Unremarkable bladder which contains a Foley catheter. Stomach/Bowel: Diffuse fluid-filled bowel extending from stomach to rectum. No bowel wall thickening or perforation seen. Negative appendix. Numerous distal colonic diverticula. Lymphatic: No mass or adenopathy. Reproductive:No pathologic findings. Other: No ascites or pneumoperitoneum. Musculoskeletal: No acute abnormalities. Review of the MIP images confirms the above findings. IMPRESSION: 1. Very motion degraded study. 2. No signs of acute aortic syndrome. 3. Possible minimal anterior rib cortex fractures. No displaced fracture. 4. Increased fluid content within bowel. No bowel wall thickening or obstructive pattern. 5. Atherosclerosis. Electronically Signed   By: Jorje Guild M.D.   On: 08/26/2021 06:14    Microbiology Recent Results (from  the past 240 hour(s))  SARS Coronavirus 2 by RT PCR (hospital order, performed in [facility] hospital lab) *cepheid single result test* Anterior Nasal Swab     Status: None   Collection Time: 08/25/2021  5:02 AM   Specimen: Anterior Nasal Swab  Result Value Ref Range Status   SARS Coronavirus 2 by RT PCR NEGATIVE NEGATIVE Final    Comment: (NOTE) SARS-CoV-2 target nucleic acids are NOT DETECTED.  The SARS-CoV-2 RNA is generally detectable in upper and lower respiratory specimens during the acute phase of infection. The lowest concentration of SARS-CoV-2 viral copies this assay can detect is 250 copies / mL.  A negative result does not preclude SARS-CoV-2 infection and should not be used as the sole basis for treatment or other patient management decisions.  A negative result may occur with improper specimen collection / handling, submission of specimen other than nasopharyngeal swab, presence of viral mutation(s) within the areas targeted by this assay, and inadequate number of viral copies (<250 copies / mL). A negative result must be combined with clinical observations, patient history, and epidemiological information.  Fact Sheet for Patients:   https://www.patel.info/  Fact Sheet for Healthcare Providers: https://hall.com/  This test is not yet approved or  cleared by the [place] FDA and has been authorized for detection and/or diagnosis of SARS-CoV-2 by FDA under an Emergency Use Authorization (EUA).  This EUA will remain in effect (meaning this test can be used) for the duration of the COVID-19 declaration under Section 564(b)(1) of the Act, 21 U.S.C. section 360bbb-3(b)(1), unless the authorization is terminated or revoked sooner.  Performed at [facility], [place] [address], [place], [place] [address]   MRSA Next Gen by PCR, Nasal     Status: None   Collection Time: 09/02/2021  7:52 AM   Specimen: Nasal Mucosa; Nasal Swab   Result Value Ref Range Status   MRSA by PCR Next Gen NOT DETECTED NOT DETECTED Final    Comment: (NOTE) The GeneXpert MRSA Assay (FDA approved for NASAL specimens only), is one component of a comprehensive MRSA colonization surveillance program. It is not intended to diagnose MRSA infection nor to guide or monitor treatment for MRSA infections. Test performance is not FDA approved in patients less than 60 years old. Performed at [facility], [place] [address], [place], [place] [address]     Lab Basic Metabolic Panel: Recent Labs  Lab 08/22/2021 2055 09/17/21 0616 09/18/21 0227 09/18/21 2001 09/19/21 0046 09-28-21 0511  NA 145 141 143  --  140 139  K 4.6 3.8 3.5  --  3.2* 3.5  CL 111 108 116*  --  111 110  CO2 22 22 21*  --  23 22  GLUCOSE 80 71 195*  --  135* 123*  BUN 19 18 16   --  16 16  CREATININE 1.50* 1.42* 1.11  --  1.12 0.91  CALCIUM 7.6* 7.5* 8.0*  --  8.1* 8.4*  MG 1.7 1.8 2.1  --  1.8 1.9  PHOS  --  4.3 1.6* 3.2 3.1 4.1   Liver Function Tests: Recent Labs  Lab 09/05/2021 0455  AST 88*  ALT 49*  ALKPHOS 69  BILITOT 0.2*  PROT 4.7*  ALBUMIN 2.5*   No results for input(s): LIPASE, AMYLASE in the last 168 hours. No results for input(s): AMMONIA in the last 168 hours. CBC: Recent Labs  Lab 08/27/2021 0455 09/06/2021 0458 08/26/2021 2055 09/17/21 0616 09/18/21 0227 09/19/21 0046 09/28/21 0511  WBC 13.0*   < > 14.6* 13.9* 10.9* 12.8* 7.9  NEUTROABS 7.8*  --   --   --   --   --   --   HGB 12.5*   < > 12.7* 12.6* 12.2* 11.1* 11.0*  HCT 38.7*   < > 41.0 40.4 37.5* 35.6* 34.2*  MCV 102.1*   < > 102.5* 102.0* 101.1* 102.0* 99.1  PLT 157   < > 137* 120* 113* 122* 134*   < > = values in this interval not displayed.   Cardiac Enzymes: No results for input(s): CKTOTAL, CKMB, CKMBINDEX, TROPONINI in the last 168 hours. Sepsis Labs:  Recent Labs  Lab 08/22/2021 0455 09/04/2021 0701 09/05/2021 0714 08/28/2021 2055 09/17/21 0616 09/18/21 0227 09/19/21 0046  10/07/2021 0511  WBC 13.0*   < >  --  14.6* 13.9* 10.9* 12.8* 7.9  LATICACIDVEN >9.0*  --  5.1* 1.3  --   --   --   --    < > = values in this interval not displayed.    Procedures/Operations     [facility] 09/21/2021, 1:35 PM

## 2021-09-22 DEATH — deceased

## 2021-12-21 IMAGING — CT CT RENAL STONE PROTOCOL
2 of 4 series · 16 of 46 positions shown, 18 images · non-contrast
Comparison: None.

CLINICAL DATA: Hematuria, melena, chest pain

EXAM:
CT ABDOMEN AND PELVIS WITHOUT CONTRAST
TECHNIQUE: Multidetector CT imaging of the abdomen and pelvis was performed
following the standard protocol without IV contrast.

[Series 3: ap without · axial · non-contrast · 0.89mm/px · z∈[+854,+1294]mm · 13 of 100 slices shown, 15 images]
[im 6/100  soft-tissue]
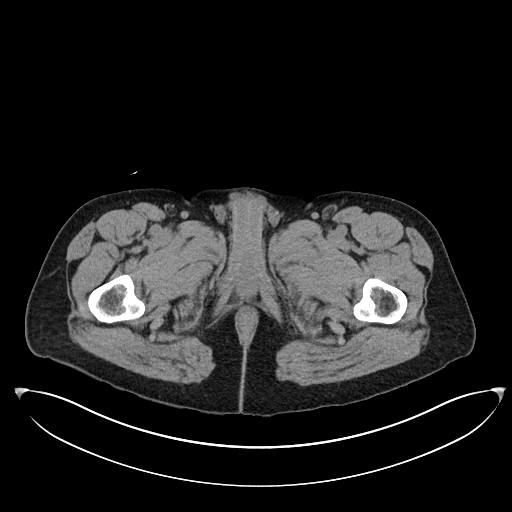
[im 6/100  bone]
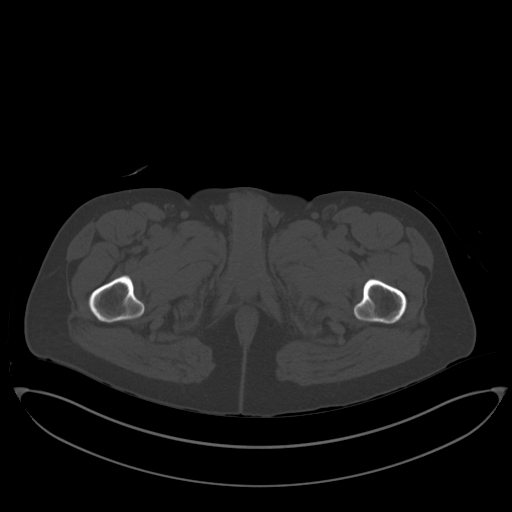
[im 16/100  soft-tissue]
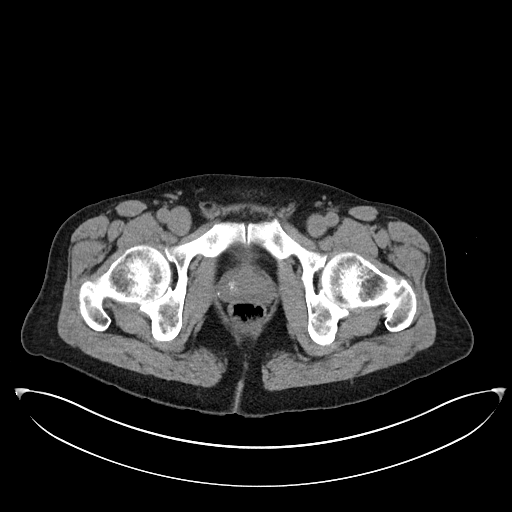
[im 21/100  soft-tissue]
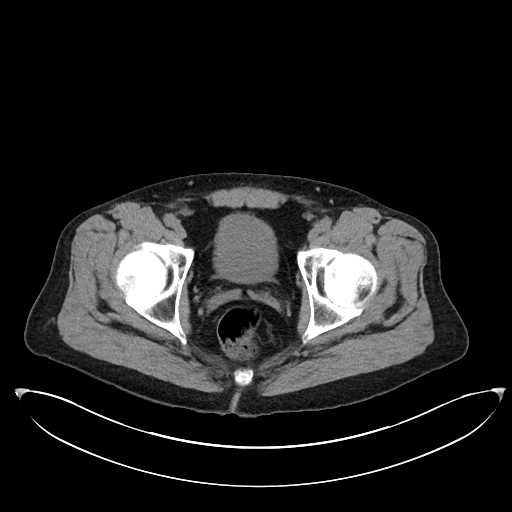
[im 27/100  soft-tissue]
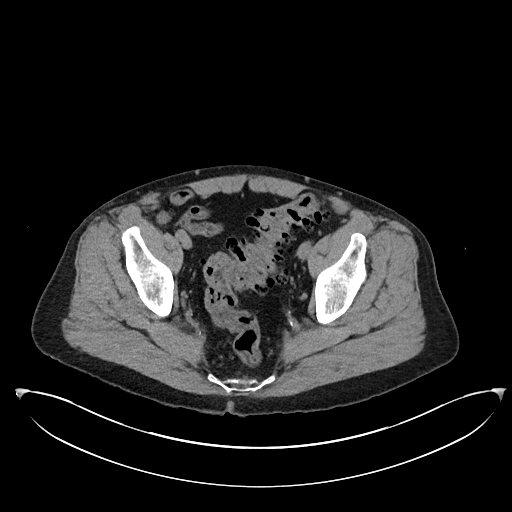
[im 37/100  soft-tissue]
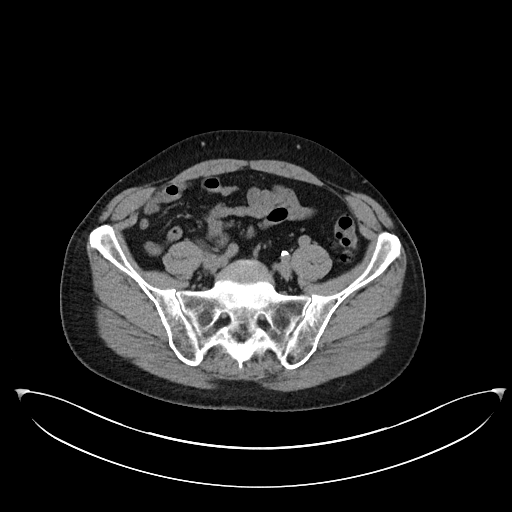
[im 42/100  soft-tissue]
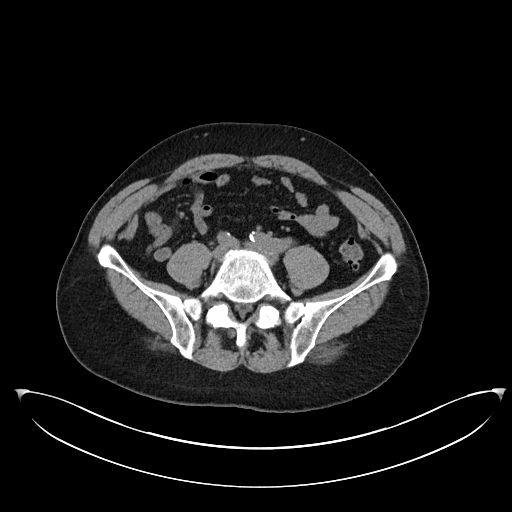
[im 53/100  soft-tissue]
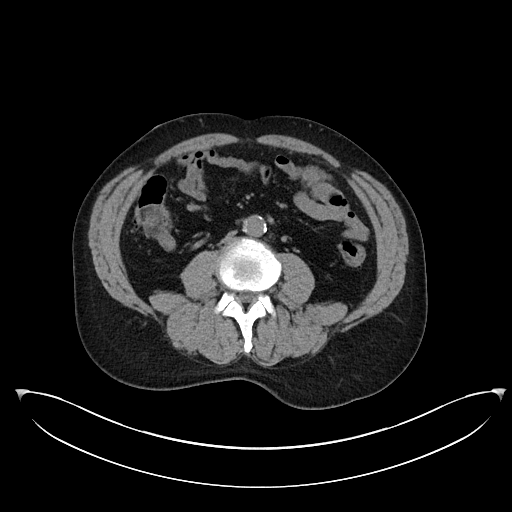
[im 58/100  soft-tissue]
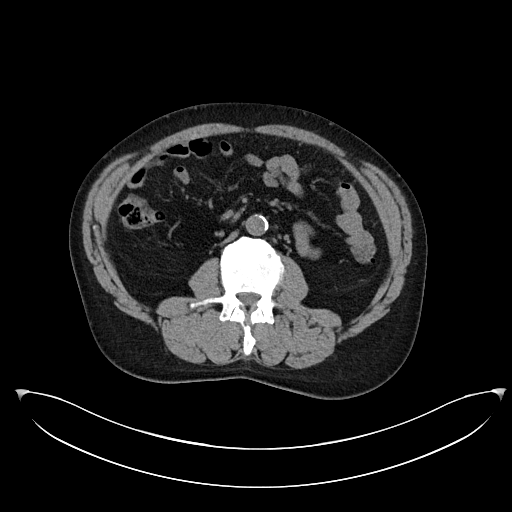
[im 63/100  soft-tissue]
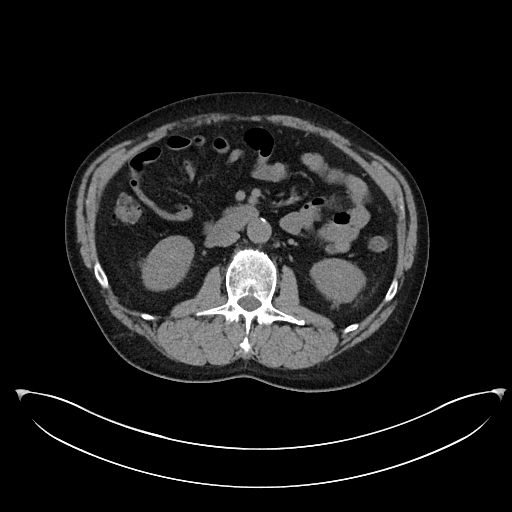
[im 63/100  bone]
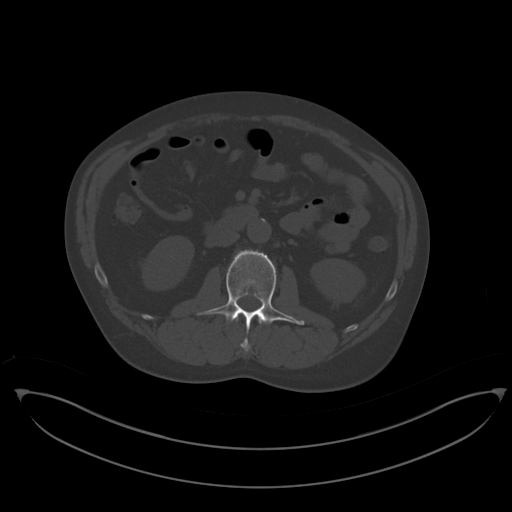
[im 73/100  soft-tissue]
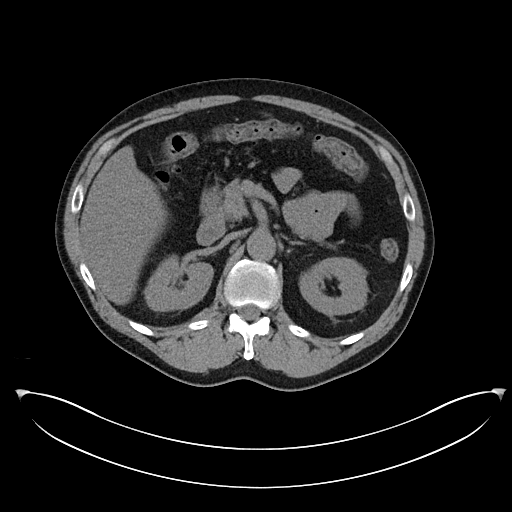
[im 79/100  soft-tissue]
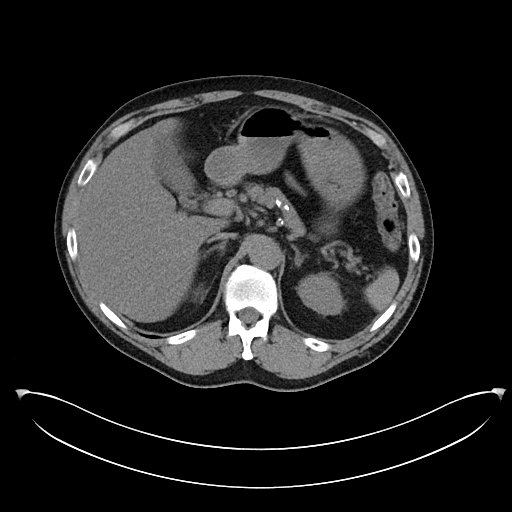
[im 84/100  soft-tissue]
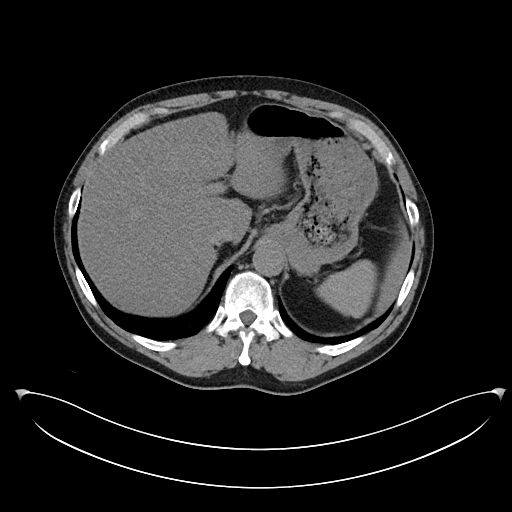
[im 94/100  soft-tissue]
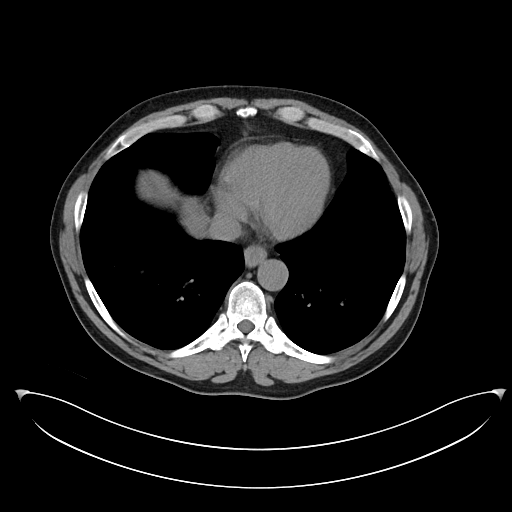

[Series 6: cor · coronal · 0.80mm/px · 3 of 103 slices shown]
[im 35/103  soft-tissue]
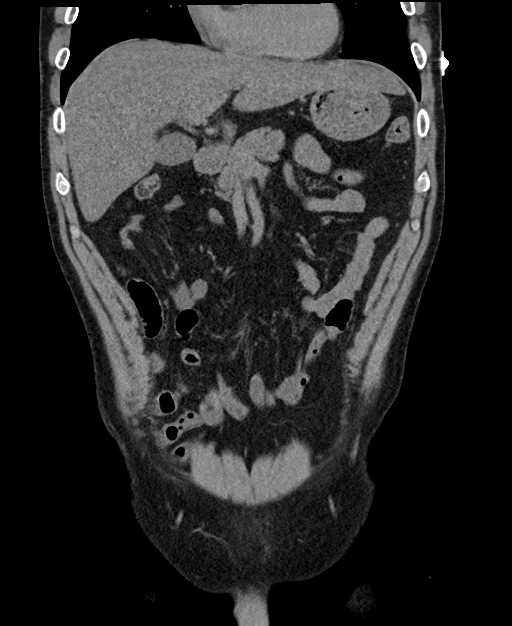
[im 46/103  soft-tissue]
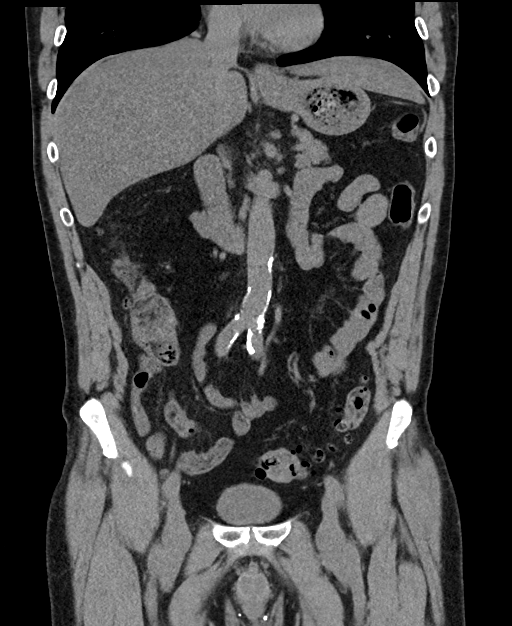
[im 57/103  soft-tissue]
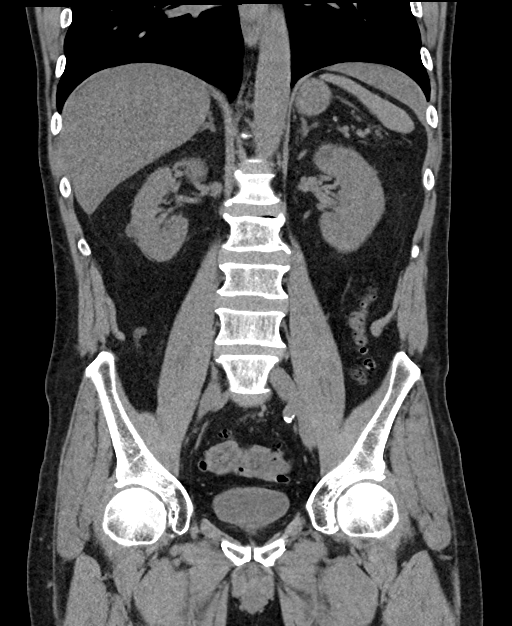

[16 of 46 positions shown; findings below may reference images not displayed]

FINDINGS: Lower chest: The visualized lung bases are clear bilaterally. The
visualized heart and pericardium are unremarkable.

Hepatobiliary: Mild hepatic steatosis. Liver and gallbladder are
otherwise unremarkable. No intra or extrahepatic biliary ductal
dilation.

Pancreas: Unremarkable

Spleen: Unremarkable

Adrenals/Urinary Tract: The adrenal glands are unremarkable. The
kidneys are normal in size and position. Indeterminate 11 mm
exophytic low-attenuation lesion arising from the a lower pole of
the right kidney laterally 9 mm simple cortical cyst arises from the
upper pole of the left kidney. No intrarenal or ureteral calculi are
identified. No hydronephrosis. The bladder is unremarkable.

Stomach/Bowel: Severe descending and sigmoid colonic diverticulosis.
The stomach, small bowel, and large bowel are otherwise
unremarkable. No evidence of obstruction or focal inflammation.
Appendix normal. No free intraperitoneal gas or fluid.

Vascular/Lymphatic: Moderate aortoiliac atherosclerotic
calcification. No aortic aneurysm. No pathologic adenopathy within
the abdomen and pelvis.

Reproductive: Prostate is unremarkable.

Other: Rectum unremarkable.

Musculoskeletal: Degenerative changes are seen within the a lumbar
spine no lytic or blastic bone lesions are seen. No acute bone
abnormality.
IMPRESSION: No nephro or urolithiasis. No definite radiographic explanation for
the patient's reported symptoms.

Indeterminate 11 mm exophytic low-attenuation lesion arising from
the lower pole the right kidney may represent a hyperdense renal
cyst or cystic renal neoplasm. This would be better assessed with
dedicated renal sonography once the patient's acute issues have
resolved.

Severe distal colonic diverticulosis.

Aortic Atherosclerosis ([id number]).
